# Patient Record
Sex: Female | Born: 1965 | Race: White | Hispanic: No | Marital: Single | Smoking: Current every day smoker
Health system: Southern US, Community
[De-identification: ages and names within clinical notes are randomized; demographics above are authoritative.]

## PROBLEM LIST (undated history)

## (undated) DIAGNOSIS — C799 Secondary malignant neoplasm of unspecified site: Secondary | ICD-10-CM

## (undated) DIAGNOSIS — E039 Hypothyroidism, unspecified: Secondary | ICD-10-CM

## (undated) DIAGNOSIS — F319 Bipolar disorder, unspecified: Secondary | ICD-10-CM

## (undated) DIAGNOSIS — C801 Malignant (primary) neoplasm, unspecified: Secondary | ICD-10-CM

## (undated) DIAGNOSIS — F4310 Post-traumatic stress disorder, unspecified: Secondary | ICD-10-CM

## (undated) DIAGNOSIS — C439 Malignant melanoma of skin, unspecified: Secondary | ICD-10-CM

## (undated) HISTORY — PX: MELANOMA EXCISION: SHX5266

---

## 2005-04-07 HISTORY — PX: MASTECTOMY: SHX3

## 2016-03-04 ENCOUNTER — Encounter (HOSPITAL_COMMUNITY): Payer: Self-pay | Admitting: Emergency Medicine

## 2016-03-04 ENCOUNTER — Inpatient Hospital Stay (HOSPITAL_COMMUNITY)
Admission: EM | Admit: 2016-03-04 | Discharge: 2016-03-08 | DRG: 070 | Disposition: A | Discharge status: Home / Self Care | Payer: Medicaid Other | Attending: Family Medicine | Admitting: Family Medicine

## 2016-03-04 ENCOUNTER — Emergency Department (HOSPITAL_COMMUNITY): Payer: Medicaid Other

## 2016-03-04 DIAGNOSIS — F17210 Nicotine dependence, cigarettes, uncomplicated: Secondary | ICD-10-CM | POA: Diagnosis present

## 2016-03-04 DIAGNOSIS — J9600 Acute respiratory failure, unspecified whether with hypoxia or hypercapnia: Secondary | ICD-10-CM | POA: Diagnosis present

## 2016-03-04 DIAGNOSIS — F1910 Other psychoactive substance abuse, uncomplicated: Secondary | ICD-10-CM | POA: Diagnosis present

## 2016-03-04 DIAGNOSIS — L02512 Cutaneous abscess of left hand: Secondary | ICD-10-CM | POA: Diagnosis present

## 2016-03-04 DIAGNOSIS — S022XXA Fracture of nasal bones, initial encounter for closed fracture: Secondary | ICD-10-CM

## 2016-03-04 DIAGNOSIS — C7972 Secondary malignant neoplasm of left adrenal gland: Secondary | ICD-10-CM | POA: Diagnosis present

## 2016-03-04 DIAGNOSIS — C7801 Secondary malignant neoplasm of right lung: Secondary | ICD-10-CM | POA: Diagnosis present

## 2016-03-04 DIAGNOSIS — Z79899 Other long term (current) drug therapy: Secondary | ICD-10-CM

## 2016-03-04 DIAGNOSIS — R55 Syncope and collapse: Secondary | ICD-10-CM

## 2016-03-04 DIAGNOSIS — Z85820 Personal history of malignant melanoma of skin: Secondary | ICD-10-CM

## 2016-03-04 DIAGNOSIS — R4182 Altered mental status, unspecified: Secondary | ICD-10-CM | POA: Diagnosis present

## 2016-03-04 DIAGNOSIS — R4781 Slurred speech: Secondary | ICD-10-CM | POA: Diagnosis present

## 2016-03-04 DIAGNOSIS — E039 Hypothyroidism, unspecified: Secondary | ICD-10-CM

## 2016-03-04 DIAGNOSIS — L8990 Pressure ulcer of unspecified site, unspecified stage: Secondary | ICD-10-CM | POA: Diagnosis present

## 2016-03-04 DIAGNOSIS — I679 Cerebrovascular disease, unspecified: Secondary | ICD-10-CM | POA: Diagnosis present

## 2016-03-04 DIAGNOSIS — F4310 Post-traumatic stress disorder, unspecified: Secondary | ICD-10-CM | POA: Diagnosis present

## 2016-03-04 DIAGNOSIS — L03012 Cellulitis of left finger: Secondary | ICD-10-CM | POA: Diagnosis present

## 2016-03-04 DIAGNOSIS — W19XXXA Unspecified fall, initial encounter: Secondary | ICD-10-CM | POA: Diagnosis present

## 2016-03-04 DIAGNOSIS — F319 Bipolar disorder, unspecified: Secondary | ICD-10-CM | POA: Diagnosis present

## 2016-03-04 DIAGNOSIS — Z79891 Long term (current) use of opiate analgesic: Secondary | ICD-10-CM | POA: Diagnosis not present

## 2016-03-04 DIAGNOSIS — Z791 Long term (current) use of non-steroidal anti-inflammatories (NSAID): Secondary | ICD-10-CM | POA: Diagnosis not present

## 2016-03-04 DIAGNOSIS — C799 Secondary malignant neoplasm of unspecified site: Secondary | ICD-10-CM

## 2016-03-04 DIAGNOSIS — C439 Malignant melanoma of skin, unspecified: Secondary | ICD-10-CM | POA: Diagnosis present

## 2016-03-04 DIAGNOSIS — J9601 Acute respiratory failure with hypoxia: Secondary | ICD-10-CM | POA: Diagnosis present

## 2016-03-04 DIAGNOSIS — S0292XA Unspecified fracture of facial bones, initial encounter for closed fracture: Secondary | ICD-10-CM | POA: Diagnosis present

## 2016-03-04 DIAGNOSIS — G8929 Other chronic pain: Secondary | ICD-10-CM | POA: Diagnosis present

## 2016-03-04 DIAGNOSIS — R410 Disorientation, unspecified: Secondary | ICD-10-CM | POA: Diagnosis present

## 2016-03-04 DIAGNOSIS — G9340 Encephalopathy, unspecified: Principal | ICD-10-CM | POA: Diagnosis present

## 2016-03-04 DIAGNOSIS — C7931 Secondary malignant neoplasm of brain: Secondary | ICD-10-CM | POA: Diagnosis present

## 2016-03-04 HISTORY — DX: Bipolar disorder, unspecified: F31.9

## 2016-03-04 HISTORY — DX: Malignant (primary) neoplasm, unspecified: C80.1

## 2016-03-04 HISTORY — DX: Secondary malignant neoplasm of unspecified site: C79.9

## 2016-03-04 HISTORY — DX: Post-traumatic stress disorder, unspecified: F43.10

## 2016-03-04 HISTORY — DX: Hypothyroidism, unspecified: E03.9

## 2016-03-04 HISTORY — DX: Malignant melanoma of skin, unspecified: C43.9

## 2016-03-04 LAB — URINALYSIS, ROUTINE W REFLEX MICROSCOPIC
Glucose, UA: NEGATIVE mg/dL
KETONES UR: NEGATIVE mg/dL
NITRITE: POSITIVE — AB
PROTEIN: NEGATIVE mg/dL
Specific Gravity, Urine: 1.026 (ref 1.005–1.030)
pH: 5.5 (ref 5.0–8.0)

## 2016-03-04 LAB — COMPREHENSIVE METABOLIC PANEL
ALK PHOS: 79 U/L (ref 38–126)
ALT: 20 U/L (ref 14–54)
ANION GAP: 14 (ref 5–15)
AST: 47 U/L — ABNORMAL HIGH (ref 15–41)
Albumin: 3.9 g/dL (ref 3.5–5.0)
BILIRUBIN TOTAL: 0.7 mg/dL (ref 0.3–1.2)
BUN: 29 mg/dL — ABNORMAL HIGH (ref 6–20)
CALCIUM: 9.2 mg/dL (ref 8.9–10.3)
CO2: 32 mmol/L (ref 22–32)
Chloride: 98 mmol/L — ABNORMAL LOW (ref 101–111)
Creatinine, Ser: 1.1 mg/dL — ABNORMAL HIGH (ref 0.44–1.00)
GFR calc non Af Amer: 58 mL/min — ABNORMAL LOW (ref >60)
Glucose, Bld: 115 mg/dL — ABNORMAL HIGH (ref 65–99)
Potassium: 4.3 mmol/L (ref 3.5–5.1)
SODIUM: 144 mmol/L (ref 135–145)
TOTAL PROTEIN: 7 g/dL (ref 6.5–8.1)

## 2016-03-04 LAB — CBC WITH DIFFERENTIAL/PLATELET
Basophils Absolute: 0 10*3/uL (ref 0.0–0.1)
Basophils Relative: 0 %
EOS ABS: 0 10*3/uL (ref 0.0–0.7)
Eosinophils Relative: 0 %
HCT: 37.4 % (ref 36.0–46.0)
HEMOGLOBIN: 11.8 g/dL — AB (ref 12.0–15.0)
LYMPHS ABS: 0.5 10*3/uL — AB (ref 0.7–4.0)
Lymphocytes Relative: 6 %
MCH: 29.8 pg (ref 26.0–34.0)
MCHC: 31.6 g/dL (ref 30.0–36.0)
MCV: 94.4 fL (ref 78.0–100.0)
MONOS PCT: 6 %
Monocytes Absolute: 0.6 10*3/uL (ref 0.1–1.0)
NEUTROS PCT: 88 %
Neutro Abs: 8 10*3/uL — ABNORMAL HIGH (ref 1.7–7.7)
Platelets: 188 10*3/uL (ref 150–400)
RBC: 3.96 MIL/uL (ref 3.87–5.11)
RDW: 14.7 % (ref 11.5–15.5)
WBC: 9.1 10*3/uL (ref 4.0–10.5)

## 2016-03-04 LAB — URINE MICROSCOPIC-ADD ON

## 2016-03-04 LAB — VALPROIC ACID LEVEL
VALPROIC ACID LVL: 28 ug/mL — AB (ref 50.0–100.0)
VALPROIC ACID LVL: 25 ug/mL — AB (ref 50.0–100.0)

## 2016-03-04 LAB — LACTIC ACID, PLASMA
Lactic Acid, Venous: 2 mmol/L (ref 0.5–1.9)
Lactic Acid, Venous: 0.7 mmol/L (ref 0.5–1.9)

## 2016-03-04 LAB — RAPID URINE DRUG SCREEN, HOSP PERFORMED
Amphetamines: NOT DETECTED
Barbiturates: NOT DETECTED
Benzodiazepines: POSITIVE — AB
COCAINE: NOT DETECTED
OPIATES: POSITIVE — AB
Tetrahydrocannabinol: POSITIVE — AB

## 2016-03-04 LAB — TROPONIN I
TROPONIN I: 0.74 ng/mL — AB (ref <0.03)
TROPONIN I: 0.56 ng/mL — AB (ref <0.03)

## 2016-03-04 LAB — TSH: TSH: 1.578 u[IU]/mL (ref 0.350–4.500)

## 2016-03-04 LAB — CK: Total CK: 670 U/L — ABNORMAL HIGH (ref 38–234)

## 2016-03-04 LAB — MAGNESIUM: Magnesium: 2.2 mg/dL (ref 1.7–2.4)

## 2016-03-04 LAB — ETHANOL

## 2016-03-04 LAB — PHOSPHORUS: PHOSPHORUS: 1.9 mg/dL — AB (ref 2.5–4.6)

## 2016-03-04 MED ORDER — PROCHLORPERAZINE MALEATE 10 MG PO TABS
10.0000 mg | ORAL_TABLET | Freq: Four times a day (QID) | ORAL | Status: DC | PRN
  Filled 2016-03-04: qty 1

## 2016-03-04 MED ORDER — QUETIAPINE FUMARATE 25 MG PO TABS
150.0000 mg | ORAL_TABLET | Freq: Every day | ORAL | Status: DC
  Administered 2016-03-05 – 2016-03-08 (×4): 150 mg via ORAL
  Filled 2016-03-04 (×4): qty 6

## 2016-03-04 MED ORDER — ONDANSETRON HCL 4 MG PO TABS: 4.0000 mg | ORAL_TABLET | Freq: Four times a day (QID) | ORAL | Status: DC | PRN

## 2016-03-04 MED ORDER — LORAZEPAM 2 MG/ML IJ SOLN
INTRAMUSCULAR | Status: AC
  Filled 2016-03-04: qty 1

## 2016-03-04 MED ORDER — SENNA 8.6 MG PO TABS
2.0000 | ORAL_TABLET | Freq: Two times a day (BID) | ORAL | Status: DC
  Administered 2016-03-05 – 2016-03-08 (×7): 17.2 mg via ORAL
  Filled 2016-03-04 (×8): qty 2

## 2016-03-04 MED ORDER — MORPHINE SULFATE ER 30 MG PO TBCR
60.0000 mg | EXTENDED_RELEASE_TABLET | Freq: Two times a day (BID) | ORAL | Status: DC
  Administered 2016-03-05 – 2016-03-08 (×7): 60 mg via ORAL
  Filled 2016-03-04 (×8): qty 2

## 2016-03-04 MED ORDER — ZIPRASIDONE HCL 20 MG PO CAPS
20.0000 mg | ORAL_CAPSULE | Freq: Once | ORAL | Status: AC
  Administered 2016-03-04: 20 mg via ORAL
  Filled 2016-03-04: qty 1

## 2016-03-04 MED ORDER — ACETAMINOPHEN 650 MG RE SUPP: 650.0000 mg | Freq: Four times a day (QID) | RECTAL | Status: DC | PRN

## 2016-03-04 MED ORDER — DIVALPROEX SODIUM 250 MG PO DR TAB
250.0000 mg | DELAYED_RELEASE_TABLET | Freq: Every day | ORAL | Status: DC
  Administered 2016-03-06 – 2016-03-07 (×2): 250 mg via ORAL
  Filled 2016-03-04 (×5): qty 1

## 2016-03-04 MED ORDER — NALOXONE HCL 0.4 MG/ML IJ SOLN: 0.4000 mg | INTRAMUSCULAR | Status: DC | PRN

## 2016-03-04 MED ORDER — LORAZEPAM 2 MG/ML IJ SOLN
1.0000 mg | Freq: Once | INTRAMUSCULAR | Status: AC
  Administered 2016-03-04: 1 mg via INTRAMUSCULAR

## 2016-03-04 MED ORDER — ONDANSETRON HCL 4 MG/2ML IJ SOLN: 4.0000 mg | Freq: Four times a day (QID) | INTRAMUSCULAR | Status: DC | PRN

## 2016-03-04 MED ORDER — LORAZEPAM 1 MG PO TABS
2.0000 mg | ORAL_TABLET | Freq: Once | ORAL | Status: AC
  Administered 2016-03-04: 2 mg via ORAL
  Filled 2016-03-04: qty 2

## 2016-03-04 MED ORDER — LEVOTHYROXINE SODIUM 25 MCG PO TABS
125.0000 ug | ORAL_TABLET | Freq: Every day | ORAL | Status: DC
  Administered 2016-03-05 – 2016-03-08 (×4): 125 ug via ORAL
  Filled 2016-03-04 (×5): qty 1

## 2016-03-04 MED ORDER — DEXTROSE 5 % IV SOLN
1.0000 g | INTRAVENOUS | Status: DC
  Filled 2016-03-04: qty 10

## 2016-03-04 MED ORDER — SODIUM CHLORIDE 0.9% FLUSH
3.0000 mL | Freq: Two times a day (BID) | INTRAVENOUS | Status: DC
  Administered 2016-03-04 – 2016-03-06 (×4): 3 mL via INTRAVENOUS

## 2016-03-04 MED ORDER — POLYETHYLENE GLYCOL 3350 17 G PO PACK: 17.0000 g | PACK | Freq: Two times a day (BID) | ORAL | Status: DC | PRN

## 2016-03-04 MED ORDER — LORAZEPAM 2 MG/ML IJ SOLN
1.0000 mg | Freq: Four times a day (QID) | INTRAMUSCULAR | Status: DC | PRN
  Administered 2016-03-04 – 2016-03-06 (×6): 1 mg via INTRAVENOUS
  Filled 2016-03-04 (×5): qty 1

## 2016-03-04 MED ORDER — NICOTINE 14 MG/24HR TD PT24
14.0000 mg | MEDICATED_PATCH | Freq: Every day | TRANSDERMAL | Status: DC
  Administered 2016-03-05 – 2016-03-08 (×4): 14 mg via TRANSDERMAL
  Filled 2016-03-04 (×4): qty 1

## 2016-03-04 MED ORDER — SODIUM CHLORIDE 0.9 % IV SOLN
INTRAVENOUS | Status: DC
  Administered 2016-03-04: 16:00:00 via INTRAVENOUS

## 2016-03-04 MED ORDER — SODIUM CHLORIDE 0.9 % IV BOLUS (SEPSIS)
1000.0000 mL | Freq: Once | INTRAVENOUS | Status: AC
  Administered 2016-03-04: 1000 mL via INTRAVENOUS

## 2016-03-04 MED ORDER — ACETAMINOPHEN 325 MG PO TABS
650.0000 mg | ORAL_TABLET | Freq: Four times a day (QID) | ORAL | Status: DC | PRN
  Administered 2016-03-06: 650 mg via ORAL
  Filled 2016-03-04: qty 2

## 2016-03-04 NOTE — ED Notes (Signed)
Pt given graham crackers and ice water, per Vicente Males, Therapist, sports.

## 2016-03-04 NOTE — ED Notes (Signed)
Patient unable to urinate at this time; patient repositioned on stretcher

## 2016-03-04 NOTE — ED Notes (Signed)
Pt placed in C collar.  Periodically O2 saturations drop to the 80s.  Nasal cannula not efffective due to facial injury.  PLaced on non re breather to increase o2 saturation to 90s.

## 2016-03-04 NOTE — ED Notes (Signed)
Assisted pt to stand at bedside with the assistance of Shanon Brow (EMT) and Martin Majestic (EMT). Couldn't tolerate with muscle spasms.

## 2016-03-04 NOTE — ED Provider Notes (Signed)
[facility] DEPT Provider Note   CSN: [id number] Arrival date & time: 03/04/16  0541     History   Chief Complaint Chief Complaint  Patient presents with  . Fall  . Facial Injury    HPI Yolanda Krueger is a 50 y.o. female.  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 Pt reports falling and landing on her face but is unable to report any other details regarding the incident. LOC unknown. Pt reports drinking alcohol last night. Denies drug use. Endorses pain to her nose and neck. Denies fever, lightheadedness, dizziness, visual changes, cough, chest pain, SOB, back pain, abdominal pain, vomiting, diarrhea, urinary sxs, extremity pain, swelling.   Level 5 caveat- AMS/slurred speech  Oncologist- Dr. Harriet Masson [facility])  Past Medical History:  Diagnosis Date  . Bipolar 1 disorder ([place])   . Cancer ([place])   . Hypothyroid   . Malignant melanoma, metastatic ([place])   . PTSD (post-traumatic stress disorder)     Patient Active Problem List   Diagnosis Date Noted  . Altered mental status 03/04/2016  . Syncope 03/04/2016  . Metastatic melanoma ([place]) 03/04/2016  . Extensive facial fractures ([place]) 03/04/2016  . Bipolar 1 disorder ([place]) 03/04/2016  . Hypothyroid 03/04/2016  . Acute respiratory failure ([place]) 03/04/2016  . PTSD (post-traumatic stress disorder) 03/04/2016  . Closed fracture of nasal bones   . Confusion     Past Surgical History:  Procedure Laterality Date  . MELANOMA EXCISION      OB History    No data available       Home Medications    Prior to Admission medications   Medication Sig Start Date End Date Taking? Authorizing Provider  divalproex (DEPAKOTE) 250 MG DR tablet Take 250 mg by mouth at bedtime.   Yes Historical Provider, MD  levothyroxine (SYNTHROID, LEVOTHROID) 125 MCG tablet Take 125 mcg by mouth daily  before breakfast.   Yes Historical Provider, MD  morphine (MS CONTIN) 100 MG 12 hr tablet Take 100 mg by mouth every 12 (twelve) hours.   Yes Historical Provider, MD  naproxen (NAPROSYN) 375 MG tablet Take 375 mg by mouth 2 (two) times daily with a meal.   Yes Historical Provider, MD  ondansetron (ZOFRAN) 8 MG tablet Take by mouth every 8 (eight) hours as needed for nausea or vomiting.   Yes Historical Provider, MD  polyethylene glycol (MIRALAX / GLYCOLAX) packet Take 17 g by mouth 2 (two) times daily as needed for mild constipation.   Yes Historical Provider, MD  prochlorperazine (COMPAZINE) 10 MG tablet Take 10 mg by mouth every 6 (six) hours as needed for nausea or vomiting.   Yes Historical Provider, MD  QUEtiapine (SEROQUEL) 100 MG tablet Take 150 mg by mouth daily.   Yes Historical Provider, MD  senna (SENOKOT) 8.6 MG TABS tablet Take 2 tablets by mouth 2 (two) times daily.   Yes Historical Provider, MD    Family History Family History  Problem Relation Age of Onset  . Family history unknown: Yes    Social History Social History  Substance Use Topics  . Smoking status: Current Every Day Smoker    Packs/day: 1.00    Types: Cigarettes  . Smokeless tobacco: Never Used  . Alcohol use Not on file     Allergies   Penicillins   Review of Systems  Review of Systems  HENT: Positive for facial swelling.   Musculoskeletal: Positive for neck pain.  Neurological: Positive for headaches.  All other systems reviewed and are negative.    Physical Exam Updated Vital Signs BP 129/78   Pulse 102   Temp 98.2 F (36.8 C) (Axillary)   Resp 14   LMP  (LMP Unknown)   SpO2 93%   Physical Exam  Constitutional: She is oriented to person, place, and time. She appears well-developed and well-nourished. No distress.  Pt with slurred speech.  HENT:  Head: Normocephalic. Head is with contusion. Head is without raccoon's eyes, without Battle's sign, without abrasion and without laceration.      Right Ear: Tympanic membrane normal. No hemotympanum.  Left Ear: Tympanic membrane normal. No hemotympanum.  Nose: Nasal deformity present. No septal deviation or nasal septal hematoma. Epistaxis (dried blood noted to bilatearl nares, no active bleeding) is observed. Right sinus exhibits no maxillary sinus tenderness and no frontal sinus tenderness. Left sinus exhibits no maxillary sinus tenderness and no frontal sinus tenderness.    Mouth/Throat: Uvula is midline, oropharynx is clear and moist and mucous membranes are normal. Abnormal dentition. No oropharyngeal exudate, posterior oropharyngeal edema, posterior oropharyngeal erythema or tonsillar abscesses. No tonsillar exudate.  Moderate swelling noted to nasal bridge with no palpable step-off or deformity. TTP. Poor dentition throughout with dried blood noted in mouth, no active bleeding.  Eyes: Conjunctivae and EOM are normal. Pupils are equal, round, and reactive to light. Right eye exhibits no discharge. Left eye exhibits no discharge. No scleral icterus.  Neck: Normal range of motion. Neck supple.  Cardiovascular: Normal rate, regular rhythm, normal heart sounds and intact distal pulses.   Pulmonary/Chest: Effort normal and breath sounds normal. No respiratory distress. She has no wheezes. She has no rales. She exhibits no tenderness.  Abdominal: Soft. Bowel sounds are normal. She exhibits no distension and no mass. There is no tenderness. There is no rebound and no guarding. No hernia.  Musculoskeletal: Normal range of motion. She exhibits tenderness. She exhibits no edema or deformity.  Midline cervical spine tenderness. No midline T, or L tenderness. C-collar in place. Full range of motion of bilateral upper and lower extremities, with 5/5 strength. Sensation intact. 2+ radial and PT pulses. Cap refill <2 seconds.   Neurological: She is alert and oriented to person, place, and time. She has normal strength. No cranial nerve deficit or  sensory deficit. GCS eye subscore is 4. GCS verbal subscore is 5. GCS motor subscore is 6.  Pt A&Ox3 however unable to recall details regarding her fall. Pt with slurred speech, endorses drinking last night. Pt follows commands. Spontaneously moving all 4 extremities.   Skin: Skin is warm and dry.  Nursing note and vitals reviewed.    ED Treatments / Results  Labs (all labs ordered are listed, but only abnormal results are displayed) Labs Reviewed  CBC WITH DIFFERENTIAL/PLATELET - Abnormal; Notable for the following:       Result Value   Hemoglobin 11.8 (*)    Neutro Abs 8.0 (*)    Lymphs Abs 0.5 (*)    All other components within normal limits  COMPREHENSIVE METABOLIC PANEL - Abnormal; Notable for the following:    Chloride 98 (*)    Glucose, Bld 115 (*)    BUN 29 (*)    Creatinine, Ser 1.10 (*)    AST 47 (*)    GFR calc non Af Amer 58 (*)    All other  components within normal limits  URINALYSIS, ROUTINE W REFLEX MICROSCOPIC (NOT AT [facility]) - Abnormal; Notable for the following:    Color, Urine AMBER (*)    APPearance HAZY (*)    Hgb urine dipstick LARGE (*)    Bilirubin Urine SMALL (*)    Nitrite POSITIVE (*)    Leukocytes, UA SMALL (*)    All other components within normal limits  RAPID URINE DRUG SCREEN, HOSP PERFORMED - Abnormal; Notable for the following:    Opiates POSITIVE (*)    Benzodiazepines POSITIVE (*)    Tetrahydrocannabinol POSITIVE (*)    All other components within normal limits  URINE MICROSCOPIC-ADD ON - Abnormal; Notable for the following:    Squamous Epithelial / LPF 6-30 (*)    Bacteria, UA MANY (*)    Casts HYALINE CASTS (*)    All other components within normal limits  VALPROIC ACID LEVEL - Abnormal; Notable for the following:    Valproic Acid Lvl 28 (*)    All other components within normal limits  URINE CULTURE  ETHANOL  PHOSPHORUS  MAGNESIUM  TSH  TROPONIN I  TROPONIN I  VALPROIC ACID LEVEL  LACTIC ACID, PLASMA  LACTIC ACID, PLASMA    POC URINE PREG, ED    EKG  EKG Interpretation  Date/Time:  Tuesday March 04 2016 05:43:03 EST Ventricular Rate:  96 PR Interval:    QRS Duration: 107 QT Interval:  371 QTC Calculation: 469 R Axis:   79 Text Interpretation:  Sinus rhythm RSR' in V1 or V2, right VCD or RVH Abnormal T, consider ischemia, anterior leads No previous tracing Confirmed by POLLINA  MD, CHRISTOPHER [phone]) on 03/04/2016 5:46:33 AM       Radiology Ct Head Wo Contrast  Result Date: 03/04/2016 CLINICAL DATA:  [place] and landed on face. Obvious nasal deformity. History of cancer. EXAM: CT HEAD WITHOUT CONTRAST CT MAXILLOFACIAL WITHOUT CONTRAST CT CERVICAL SPINE WITHOUT CONTRAST TECHNIQUE: Multidetector CT imaging of the head, cervical spine, and maxillofacial structures were performed using the standard protocol without intravenous contrast. Multiplanar CT image reconstructions of the cervical spine and maxillofacial structures were also generated. COMPARISON:  None. FINDINGS: CT HEAD FINDINGS Brain: No evidence for acute hemorrhage, mass lesion, midline shift, hydrocephalus or large infarct. There is focal low-density in the right frontal white matter which is nonspecific but suggestive for an old insult. Vascular: No hyperdense vessel or unexpected calcification. Skull: Nasal bone fractures. Refer to the facial CT for further characterization. No calvarial fracture. Other: None. CT MAXILLOFACIAL FINDINGS Osseous: There bilateral nasal bones fractures. The right nasal bone fracture demonstrates posterior displacement or depression. The anterior aspect of the nasal septum is fractured. Pterygoid plates appear to be intact. Zygomatic arches are intact. Mandibular condyles are located. There is focal bone loss involving the left mandibular ramus. Cannot exclude focal erosion at this site. There is a small bone fragment or chip involving the left mandibular condyle which may be chronic. There is erosion or fracture of the  remaining right lower molar. Evidence for a caries involving the most posterior left lower tooth. Patient is missing multiple teeth. Degenerative disc and endplate changes in the cervical spine. Prevertebral soft tissues are within normal limits. Orbits: Orbital bones are intact. Sinuses: Minimal mucosal thickening in the anterior ethmoid air cells. Small polyp in the right maxillary sinus. Mastoid air cells are aerated. Soft tissues: Normal appearance of both globes. Soft tissue swelling at the nose and the appears to be a hematoma along the anterior  right side of the nose. There is subcutaneous edema in the left submandibular region. There is edema along the left side of the neck. Parapharyngeal fat planes appear to be preserved. CT CERVICAL SPINE FINDINGS Alignment: Mild straightening of the cervical spine. Skull base and vertebrae: Negative for an acute fracture or dislocation. Soft tissues and spinal canal: Evidence for prior surgery on the left side of the neck with subcutaneous stranding or edema. Looks like there has been resection of the left sternocleidomastoid muscle. Evaluation of the neck tissue is limited without intravenous contrast. There is edema along the left side of the neck. Disc levels: There is marked disc space narrowing at C5-C6 with endplate changes. Broad-based disc osteophyte complex at C5-C6. Mild disc space narrowing at C4-C5 and C6-C7. There appears to be a left paracentral disc herniation or disc osteophyte complex at C6-C7. Upper chest: Emphysematous changes at the lung apices. Appears to be focal scarring at the left lung apex. Other: None IMPRESSION: No acute intracranial abnormality. Focal low density in the right frontal white matter may represent an old insult. Bilateral nasal bone fractures and fracture of the nasal septum. Extensive soft tissue swelling and hematoma formation at the nose. No acute bone abnormality in the cervical spine with multilevel degenerative changes,  particularly at C5-C6. Evidence for postsurgical changes on the left side of the neck with subcutaneous edema which could be related to postoperative change. Emphysematous changes at the lung apices with presumed scarring at the left lung apex. Focal lucency involving the left mandibular ramus is nonspecific. Cannot exclude a focal erosion in this area. There is also a small bone fragment or chip at the left mandibular condyle which is age indeterminate. Electronically Signed   By: Markus Daft M.D.   On: 03/04/2016 08:33   Ct Cervical Spine Wo Contrast  Result Date: 03/04/2016 CLINICAL DATA:  [place] and landed on face. Obvious nasal deformity. History of cancer. EXAM: CT HEAD WITHOUT CONTRAST CT MAXILLOFACIAL WITHOUT CONTRAST CT CERVICAL SPINE WITHOUT CONTRAST TECHNIQUE: Multidetector CT imaging of the head, cervical spine, and maxillofacial structures were performed using the standard protocol without intravenous contrast. Multiplanar CT image reconstructions of the cervical spine and maxillofacial structures were also generated. COMPARISON:  None. FINDINGS: CT HEAD FINDINGS Brain: No evidence for acute hemorrhage, mass lesion, midline shift, hydrocephalus or large infarct. There is focal low-density in the right frontal white matter which is nonspecific but suggestive for an old insult. Vascular: No hyperdense vessel or unexpected calcification. Skull: Nasal bone fractures. Refer to the facial CT for further characterization. No calvarial fracture. Other: None. CT MAXILLOFACIAL FINDINGS Osseous: There bilateral nasal bones fractures. The right nasal bone fracture demonstrates posterior displacement or depression. The anterior aspect of the nasal septum is fractured. Pterygoid plates appear to be intact. Zygomatic arches are intact. Mandibular condyles are located. There is focal bone loss involving the left mandibular ramus. Cannot exclude focal erosion at this site. There is a small bone fragment or chip  involving the left mandibular condyle which may be chronic. There is erosion or fracture of the remaining right lower molar. Evidence for a caries involving the most posterior left lower tooth. Patient is missing multiple teeth. Degenerative disc and endplate changes in the cervical spine. Prevertebral soft tissues are within normal limits. Orbits: Orbital bones are intact. Sinuses: Minimal mucosal thickening in the anterior ethmoid air cells. Small polyp in the right maxillary sinus. Mastoid air cells are aerated. Soft tissues: Normal appearance of both globes. Soft tissue  swelling at the nose and the appears to be a hematoma along the anterior right side of the nose. There is subcutaneous edema in the left submandibular region. There is edema along the left side of the neck. Parapharyngeal fat planes appear to be preserved. CT CERVICAL SPINE FINDINGS Alignment: Mild straightening of the cervical spine. Skull base and vertebrae: Negative for an acute fracture or dislocation. Soft tissues and spinal canal: Evidence for prior surgery on the left side of the neck with subcutaneous stranding or edema. Looks like there has been resection of the left sternocleidomastoid muscle. Evaluation of the neck tissue is limited without intravenous contrast. There is edema along the left side of the neck. Disc levels: There is marked disc space narrowing at C5-C6 with endplate changes. Broad-based disc osteophyte complex at C5-C6. Mild disc space narrowing at C4-C5 and C6-C7. There appears to be a left paracentral disc herniation or disc osteophyte complex at C6-C7. Upper chest: Emphysematous changes at the lung apices. Appears to be focal scarring at the left lung apex. Other: None IMPRESSION: No acute intracranial abnormality. Focal low density in the right frontal white matter may represent an old insult. Bilateral nasal bone fractures and fracture of the nasal septum. Extensive soft tissue swelling and hematoma formation at the  nose. No acute bone abnormality in the cervical spine with multilevel degenerative changes, particularly at C5-C6. Evidence for postsurgical changes on the left side of the neck with subcutaneous edema which could be related to postoperative change. Emphysematous changes at the lung apices with presumed scarring at the left lung apex. Focal lucency involving the left mandibular ramus is nonspecific. Cannot exclude a focal erosion in this area. There is also a small bone fragment or chip at the left mandibular condyle which is age indeterminate. Electronically Signed   By: Markus Daft M.D.   On: 03/04/2016 08:33   Dg Chest Port 1 View  Result Date: 03/04/2016 CLINICAL DATA:  Fall.  Chest pain. EXAM: PORTABLE CHEST 1 VIEW COMPARISON:  None. FINDINGS: The heart size and mediastinal contours are within normal limits. Both lungs are clear. No pneumothorax or pleural effusion is noted. Bilateral axillary surgical clips are noted. The visualized skeletal structures are unremarkable. IMPRESSION: No acute cardiopulmonary abnormality seen. Electronically Signed   By: Marijo Conception, M.D.   On: 03/04/2016 08:33   Ct Maxillofacial Wo Contrast  Result Date: 03/04/2016 CLINICAL DATA:  [place] and landed on face. Obvious nasal deformity. History of cancer. EXAM: CT HEAD WITHOUT CONTRAST CT MAXILLOFACIAL WITHOUT CONTRAST CT CERVICAL SPINE WITHOUT CONTRAST TECHNIQUE: Multidetector CT imaging of the head, cervical spine, and maxillofacial structures were performed using the standard protocol without intravenous contrast. Multiplanar CT image reconstructions of the cervical spine and maxillofacial structures were also generated. COMPARISON:  None. FINDINGS: CT HEAD FINDINGS Brain: No evidence for acute hemorrhage, mass lesion, midline shift, hydrocephalus or large infarct. There is focal low-density in the right frontal white matter which is nonspecific but suggestive for an old insult. Vascular: No hyperdense vessel or  unexpected calcification. Skull: Nasal bone fractures. Refer to the facial CT for further characterization. No calvarial fracture. Other: None. CT MAXILLOFACIAL FINDINGS Osseous: There bilateral nasal bones fractures. The right nasal bone fracture demonstrates posterior displacement or depression. The anterior aspect of the nasal septum is fractured. Pterygoid plates appear to be intact. Zygomatic arches are intact. Mandibular condyles are located. There is focal bone loss involving the left mandibular ramus. Cannot exclude focal erosion at this site. There is  a small bone fragment or chip involving the left mandibular condyle which may be chronic. There is erosion or fracture of the remaining right lower molar. Evidence for a caries involving the most posterior left lower tooth. Patient is missing multiple teeth. Degenerative disc and endplate changes in the cervical spine. Prevertebral soft tissues are within normal limits. Orbits: Orbital bones are intact. Sinuses: Minimal mucosal thickening in the anterior ethmoid air cells. Small polyp in the right maxillary sinus. Mastoid air cells are aerated. Soft tissues: Normal appearance of both globes. Soft tissue swelling at the nose and the appears to be a hematoma along the anterior right side of the nose. There is subcutaneous edema in the left submandibular region. There is edema along the left side of the neck. Parapharyngeal fat planes appear to be preserved. CT CERVICAL SPINE FINDINGS Alignment: Mild straightening of the cervical spine. Skull base and vertebrae: Negative for an acute fracture or dislocation. Soft tissues and spinal canal: Evidence for prior surgery on the left side of the neck with subcutaneous stranding or edema. Looks like there has been resection of the left sternocleidomastoid muscle. Evaluation of the neck tissue is limited without intravenous contrast. There is edema along the left side of the neck. Disc levels: There is marked disc space  narrowing at C5-C6 with endplate changes. Broad-based disc osteophyte complex at C5-C6. Mild disc space narrowing at C4-C5 and C6-C7. There appears to be a left paracentral disc herniation or disc osteophyte complex at C6-C7. Upper chest: Emphysematous changes at the lung apices. Appears to be focal scarring at the left lung apex. Other: None IMPRESSION: No acute intracranial abnormality. Focal low density in the right frontal white matter may represent an old insult. Bilateral nasal bone fractures and fracture of the nasal septum. Extensive soft tissue swelling and hematoma formation at the nose. No acute bone abnormality in the cervical spine with multilevel degenerative changes, particularly at C5-C6. Evidence for postsurgical changes on the left side of the neck with subcutaneous edema which could be related to postoperative change. Emphysematous changes at the lung apices with presumed scarring at the left lung apex. Focal lucency involving the left mandibular ramus is nonspecific. Cannot exclude a focal erosion in this area. There is also a small bone fragment or chip at the left mandibular condyle which is age indeterminate. Electronically Signed   By: Markus Daft M.D.   On: 03/04/2016 08:33    Procedures Procedures (including critical care time)  Medications Ordered in ED Medications  LORazepam (ATIVAN) 2 MG/ML injection (not administered)  divalproex (DEPAKOTE) DR tablet 250 mg (not administered)  levothyroxine (SYNTHROID, LEVOTHROID) tablet 125 mcg (not administered)  prochlorperazine (COMPAZINE) tablet 10 mg (not administered)  QUEtiapine (SEROQUEL) tablet 150 mg (not administered)  senna (SENOKOT) tablet 17.2 mg (not administered)  polyethylene glycol (MIRALAX / GLYCOLAX) packet 17 g (not administered)  sodium chloride flush (NS) 0.9 % injection 3 mL (not administered)  0.9 %  sodium chloride infusion (not administered)  acetaminophen (TYLENOL) tablet 650 mg (not administered)    Or    acetaminophen (TYLENOL) suppository 650 mg (not administered)  ondansetron (ZOFRAN) tablet 4 mg (not administered)    Or  ondansetron (ZOFRAN) injection 4 mg (not administered)  morphine (MS CONTIN) 12 hr tablet 60 mg (not administered)  naloxone (NARCAN) injection 0.4 mg (not administered)  nicotine (NICODERM CQ - dosed in mg/24 hours) patch 14 mg (not administered)  LORazepam (ATIVAN) injection 1 mg (1 mg Intravenous Given 03/04/16 1509)  cefTRIAXone (  ROCEPHIN) 1 g in dextrose 5 % 50 mL IVPB (not administered)  LORazepam (ATIVAN) tablet 2 mg (not administered)  sodium chloride 0.9 % bolus 1,000 mL (0 mLs Intravenous Stopped 03/04/16 0945)  LORazepam (ATIVAN) injection 1 mg (1 mg Intramuscular Given 03/04/16 1330)  ziprasidone (GEODON) capsule 20 mg (20 mg Oral Given 03/04/16 1458)     Initial Impression / Assessment and Plan / ED Course  I have reviewed the triage vital signs and the nursing notes.  Pertinent labs & imaging results that were available during my care of the patient were reviewed by me and considered in my medical decision making (see chart for details).  Clinical Course     Patient presents via EMS status post unwitnessed fall with altered mental status. History of melanoma with metastases to brain. Patient endorses drinking alcohol last night but is unable to give details regarding her fall. Endorses pain to nose, denies any other sxs. VSS. Exam revealed moderate swelling and contusion to nasal bridge with contusion to left eyebrow. Midline cervical spine tenderness, no step offs or deformities noted. Patient alert and oriented 4. Patient with slurred speech however following commands and answering questions. No other neuro deficits noted.   EtOH negative. UDS positive for benzos, THC and opiates. BUN 29. Creatinine 1.1. Chest x-ray negative. CT head, cervical spine and maxillofacial revealed bilateral nasal bone fractures and fracture of nasal septum with soft tissue  swelling and hematoma present, no acute intracranial abnormalities.  Discussed pt with Dr. Ashok Cordia who evaluated the pt in the ED. Pt unable to ambulate in the ED. Plan to admit pt for AMS. Consulted hospitalist. Dr. Marily Memos agrees to admission. Consulted facial trauma regarding nasal fx, Dr. Merri Ray reviewed imaging and reports emergent tx/surgery is not warranted at this time. Plan to evaluate pt during her admission.  I was contacted by pt's oncologist Dr. Darleen Crocker. She Reported that patient was initially diagnosed with melanoma and 2010 and had a radical neck surgery performed. Patient was then lost follow-up and reestablish care with Dr. Darleen Crocker in [id number]. At that time patient was started on chemotherapy and had an MRI brain performed in November 2016 showing brain metastases. She states that patient was admitted last Tuesday for change in behavior and slurred speech. An MR brain was performed on 02/26/16 which showed stability of medial right frontal lobe lesion with no acute changes. She states she had patient evaluated by her psychiatrist during her admission due to concern for patient having a psychiatric problem however after [facility] evaluation they thought the patient's symptoms were due to medical problems. Patient is scheduled to have an EGD performed next week to evaluate pain possibly related to side effects from her chemotherapy.  Final Clinical Impressions(s) / ED Diagnoses   Final diagnoses:  Closed fracture of nasal bone, initial encounter  Fall, initial encounter  Altered mental status, unspecified altered mental status type  Metastatic melanoma ([place])  Confusion  Syncope  Metastatic cancer [facility])    New Prescriptions New Prescriptions   No medications on file     Nona Dell, PA-C 03/04/16 [place], MD 03/05/16 [phone]

## 2016-03-04 NOTE — ED Triage Notes (Signed)
Per EMS pt fell and landed on her face.  Obvious nasal deformity.  History of brain cancer with metastasis.  She is a pt at [facility], recently moved here.  Pt has muscle spasms that cause her to involuntarily move sporadically

## 2016-03-04 NOTE — ED Notes (Signed)
Pt pulled

## 2016-03-04 NOTE — Progress Notes (Signed)
Pt. Arrived to unit 5W with security and two ED techs. Pt. Arrived without IV access, and confused/agitated. Pt. Speech garbled and patient continuing to try to climb out of bed and remove clothing. Pt. Was given 2mg  Ativan orally and MD notified. MD verbal orders for 5mg  Haldol IM and soft wrist restraints. IV team consulted to start IV and was placed successfully. NS started at 75 ml/hr. Pt. Ativan began to take effect and patient relaxed. MD verbal orders not initiated at this time. Unit nurse tech placed at bedside as Air cabin crew. Will continue to monitor.

## 2016-03-04 NOTE — H&P (Addendum)
History and Physical    Yolanda Krueger [id number] DOB: 1966-03-12 DOA: 03/04/2016  PCP: No primary care provider on file. Patient coming from:  home  Chief Complaint:  Fall - AMS  HPI: Yolanda Krueger is a 50 y.o. 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 Pt presenting w/ intermittent confusion and facial injuries. Level 5 caveat applies as pt confused and unable to provide history at time of my examination. Pt w/o contact information listed in the chart for family/friends. History provided on limited basis but patient and workup principally by nursing and EDP reports. Reports patient had a single fall. Fell straight forward striking her nose. Unclear if there is loss of consciousness. Reports drinking alcohol the night prior to episode. Denies drug use. Difficulty breathing through her nose after fall. Faulty pain in left side of face. Per report there've been no reported fevers, chest pain, cough, shortness of breath, vertigo, abdominal pain, diarrhea, emesis, dysuria or frequency.   ED Course: Objective findings below. Patient became somewhat belligerent for a time and was given Ativan. Patient appears to be moderately hypoxic typically when resting deeply and trying to breath through her nose.   Review of Systems: As per HPI otherwise 10 point review of systems negative.   Ambulatory Status: no restrictions  Past Medical History:  Diagnosis Date  . Bipolar 1 disorder ([place])   . Cancer ([place])   . Hypothyroid   . Malignant melanoma, metastatic ([place])   . PTSD (post-traumatic stress disorder)     Past Surgical History:  Procedure Laterality Date  . MELANOMA EXCISION      Social History   Social History  . Marital status: Single    Spouse name: N/A  . Number of children: N/A  . Years of education: N/A   Occupational History  . Not  on file.   Social History Main Topics  . Smoking status: Current Every Day Smoker    Packs/day: 1.00    Types: Cigarettes  . Smokeless tobacco: Never Used  . Alcohol use Not on file  . Drug use: Unknown  . Sexual activity: Not on file   Other Topics Concern  . Not on file   Social History Narrative  . No narrative on file    Allergies  Allergen Reactions  . Penicillins Rash    Family History  Problem Relation Age of Onset  . Family history unknown: Yes  unable to obtain due to pts condition  Prior to Admission medications   Medication Sig Start Date End Date Taking? Authorizing Provider  divalproex (DEPAKOTE) 250 MG DR tablet Take 250 mg by mouth at bedtime.   Yes Historical Provider, MD  levothyroxine (SYNTHROID, LEVOTHROID) 125 MCG tablet Take 125 mcg by mouth daily before breakfast.   Yes Historical Provider, MD  morphine (MS CONTIN) 100 MG 12 hr tablet Take 100 mg by mouth every 12 (twelve) hours.   Yes Historical Provider, MD  naproxen (NAPROSYN) 375 MG tablet Take 375 mg by mouth 2 (two) times daily with a meal.   Yes Historical Provider, MD  ondansetron (ZOFRAN) 8 MG tablet Take by mouth every 8 (eight) hours as needed for nausea or vomiting.   Yes Historical Provider, MD  polyethylene glycol (MIRALAX / GLYCOLAX) packet Take 17 g by mouth 2 (two) times daily as needed for mild constipation.   Yes Historical Provider, MD  prochlorperazine (  COMPAZINE) 10 MG tablet Take 10 mg by mouth every 6 (six) hours as needed for nausea or vomiting.   Yes Historical Provider, MD  QUEtiapine (SEROQUEL) 100 MG tablet Take 150 mg by mouth daily.   Yes Historical Provider, MD  senna (SENOKOT) 8.6 MG TABS tablet Take 2 tablets by mouth 2 (two) times daily.   Yes Historical Provider, MD    Physical Exam: Vitals:   03/04/16 1215 03/04/16 1240 03/04/16 1245 03/04/16 1315  BP: 116/96 118/86 111/78 129/78  Pulse: 103 108 106 102  Resp:  14    Temp:      TempSrc:      SpO2: (!) 89% 99%  100% 93%     General:  Appears calm and comfortable Eyes:  PERRL, EOMI, normal lids, iris ENT: Nasal bridge significantly enlarged and tender to palpation. Left jaw tender from the angle of the mandible to the TMJ. Dry mucous membranes. Neck:  no LAD, masses or thyromegaly Cardiovascular:  RRR, II/VI systolic murmur. No LE edema.  Respiratory:  CTA bilaterally, no w/r/r. Normal respiratory effort. Abdomen:  soft, ntnd, NABS Skin: Areas of abrasion noted on patient's face, hand, leg Musculoskeletal:  grossly normal tone BUE/BLE, good ROM, no bony abnormality Psychiatric: Difficult to fully assess due to patient's mental status. Will not follow basic commands. Alert and oriented 0. Quickly falls back to sleep. Neurologic: Unable to fully assess. No facial asymmetry. Moves all extremities. Sensation appears intact. CN 2-12 grossly intact, moves all extremities in coordinated fashion, sensation intact  Labs on Admission: I have personally reviewed following labs and imaging studies  CBC:  Recent Labs Lab 03/04/16 0623  WBC 9.1  NEUTROABS 8.0*  HGB 11.8*  HCT 37.4  MCV 94.4  PLT 0000000   Basic Metabolic Panel:  Recent Labs Lab 03/04/16 0623  NA 144  K 4.3  CL 98*  CO2 32  GLUCOSE 115*  BUN 29*  CREATININE 1.10*  CALCIUM 9.2   GFR: CrCl cannot be calculated (Unknown ideal weight.). Liver Function Tests:  Recent Labs Lab 03/04/16 0623  AST 47*  ALT 20  ALKPHOS 79  BILITOT 0.7  PROT 7.0  ALBUMIN 3.9   No results for input(s): LIPASE, AMYLASE in the last 168 hours. No results for input(s): AMMONIA in the last 168 hours. Coagulation Profile: No results for input(s): INR, PROTIME in the last 168 hours. Cardiac Enzymes: No results for input(s): CKTOTAL, CKMB, CKMBINDEX, TROPONINI in the last 168 hours. BNP (last 3 results) No results for input(s): PROBNP in the last 8760 hours. HbA1C: No results for input(s): HGBA1C in the last 72 hours. CBG: No results for  input(s): GLUCAP in the last 168 hours. Lipid Profile: No results for input(s): CHOL, HDL, LDLCALC, TRIG, CHOLHDL, LDLDIRECT in the last 72 hours. Thyroid Function Tests: No results for input(s): TSH, T4TOTAL, FREET4, T3FREE, THYROIDAB in the last 72 hours. Anemia Panel: No results for input(s): VITAMINB12, FOLATE, FERRITIN, TIBC, IRON, RETICCTPCT in the last 72 hours. Urine analysis:    Component Value Date/Time   COLORURINE AMBER (A) 03/04/2016 1007   APPEARANCEUR HAZY (A) 03/04/2016 1007   LABSPEC 1.026 03/04/2016 1007   PHURINE 5.5 03/04/2016 1007   GLUCOSEU NEGATIVE 03/04/2016 1007   HGBUR LARGE (A) 03/04/2016 1007   BILIRUBINUR SMALL (A) 03/04/2016 1007   KETONESUR NEGATIVE 03/04/2016 1007   PROTEINUR NEGATIVE 03/04/2016 1007   NITRITE POSITIVE (A) 03/04/2016 1007   LEUKOCYTESUR SMALL (A) 03/04/2016 1007    Creatinine Clearance: CrCl cannot be  calculated (Unknown ideal weight.).  Sepsis Labs: @LABRCNTIP (procalcitonin:4,lacticidven:4) )No results found for this or any previous visit (from the past 240 hour(s)).   Radiological Exams on Admission: Ct Head Wo Contrast  Result Date: 03/04/2016 CLINICAL DATA:  [place] and landed on face. Obvious nasal deformity. History of cancer. EXAM: CT HEAD WITHOUT CONTRAST CT MAXILLOFACIAL WITHOUT CONTRAST CT CERVICAL SPINE WITHOUT CONTRAST TECHNIQUE: Multidetector CT imaging of the head, cervical spine, and maxillofacial structures were performed using the standard protocol without intravenous contrast. Multiplanar CT image reconstructions of the cervical spine and maxillofacial structures were also generated. COMPARISON:  None. FINDINGS: CT HEAD FINDINGS Brain: No evidence for acute hemorrhage, mass lesion, midline shift, hydrocephalus or large infarct. There is focal low-density in the right frontal white matter which is nonspecific but suggestive for an old insult. Vascular: No hyperdense vessel or unexpected calcification. Skull: Nasal bone  fractures. Refer to the facial CT for further characterization. No calvarial fracture. Other: None. CT MAXILLOFACIAL FINDINGS Osseous: There bilateral nasal bones fractures. The right nasal bone fracture demonstrates posterior displacement or depression. The anterior aspect of the nasal septum is fractured. Pterygoid plates appear to be intact. Zygomatic arches are intact. Mandibular condyles are located. There is focal bone loss involving the left mandibular ramus. Cannot exclude focal erosion at this site. There is a small bone fragment or chip involving the left mandibular condyle which may be chronic. There is erosion or fracture of the remaining right lower molar. Evidence for a caries involving the most posterior left lower tooth. Patient is missing multiple teeth. Degenerative disc and endplate changes in the cervical spine. Prevertebral soft tissues are within normal limits. Orbits: Orbital bones are intact. Sinuses: Minimal mucosal thickening in the anterior ethmoid air cells. Small polyp in the right maxillary sinus. Mastoid air cells are aerated. Soft tissues: Normal appearance of both globes. Soft tissue swelling at the nose and the appears to be a hematoma along the anterior right side of the nose. There is subcutaneous edema in the left submandibular region. There is edema along the left side of the neck. Parapharyngeal fat planes appear to be preserved. CT CERVICAL SPINE FINDINGS Alignment: Mild straightening of the cervical spine. Skull base and vertebrae: Negative for an acute fracture or dislocation. Soft tissues and spinal canal: Evidence for prior surgery on the left side of the neck with subcutaneous stranding or edema. Looks like there has been resection of the left sternocleidomastoid muscle. Evaluation of the neck tissue is limited without intravenous contrast. There is edema along the left side of the neck. Disc levels: There is marked disc space narrowing at C5-C6 with endplate changes.  Broad-based disc osteophyte complex at C5-C6. Mild disc space narrowing at C4-C5 and C6-C7. There appears to be a left paracentral disc herniation or disc osteophyte complex at C6-C7. Upper chest: Emphysematous changes at the lung apices. Appears to be focal scarring at the left lung apex. Other: None IMPRESSION: No acute intracranial abnormality. Focal low density in the right frontal white matter may represent an old insult. Bilateral nasal bone fractures and fracture of the nasal septum. Extensive soft tissue swelling and hematoma formation at the nose. No acute bone abnormality in the cervical spine with multilevel degenerative changes, particularly at C5-C6. Evidence for postsurgical changes on the left side of the neck with subcutaneous edema which could be related to postoperative change. Emphysematous changes at the lung apices with presumed scarring at the left lung apex. Focal lucency involving the left mandibular ramus is nonspecific. Cannot  exclude a focal erosion in this area. There is also a small bone fragment or chip at the left mandibular condyle which is age indeterminate. Electronically Signed   By: Markus Daft M.D.   On: 03/04/2016 08:33   Ct Cervical Spine Wo Contrast  Result Date: 03/04/2016 CLINICAL DATA:  [place] and landed on face. Obvious nasal deformity. History of cancer. EXAM: CT HEAD WITHOUT CONTRAST CT MAXILLOFACIAL WITHOUT CONTRAST CT CERVICAL SPINE WITHOUT CONTRAST TECHNIQUE: Multidetector CT imaging of the head, cervical spine, and maxillofacial structures were performed using the standard protocol without intravenous contrast. Multiplanar CT image reconstructions of the cervical spine and maxillofacial structures were also generated. COMPARISON:  None. FINDINGS: CT HEAD FINDINGS Brain: No evidence for acute hemorrhage, mass lesion, midline shift, hydrocephalus or large infarct. There is focal low-density in the right frontal white matter which is nonspecific but suggestive for an  old insult. Vascular: No hyperdense vessel or unexpected calcification. Skull: Nasal bone fractures. Refer to the facial CT for further characterization. No calvarial fracture. Other: None. CT MAXILLOFACIAL FINDINGS Osseous: There bilateral nasal bones fractures. The right nasal bone fracture demonstrates posterior displacement or depression. The anterior aspect of the nasal septum is fractured. Pterygoid plates appear to be intact. Zygomatic arches are intact. Mandibular condyles are located. There is focal bone loss involving the left mandibular ramus. Cannot exclude focal erosion at this site. There is a small bone fragment or chip involving the left mandibular condyle which may be chronic. There is erosion or fracture of the remaining right lower molar. Evidence for a caries involving the most posterior left lower tooth. Patient is missing multiple teeth. Degenerative disc and endplate changes in the cervical spine. Prevertebral soft tissues are within normal limits. Orbits: Orbital bones are intact. Sinuses: Minimal mucosal thickening in the anterior ethmoid air cells. Small polyp in the right maxillary sinus. Mastoid air cells are aerated. Soft tissues: Normal appearance of both globes. Soft tissue swelling at the nose and the appears to be a hematoma along the anterior right side of the nose. There is subcutaneous edema in the left submandibular region. There is edema along the left side of the neck. Parapharyngeal fat planes appear to be preserved. CT CERVICAL SPINE FINDINGS Alignment: Mild straightening of the cervical spine. Skull base and vertebrae: Negative for an acute fracture or dislocation. Soft tissues and spinal canal: Evidence for prior surgery on the left side of the neck with subcutaneous stranding or edema. Looks like there has been resection of the left sternocleidomastoid muscle. Evaluation of the neck tissue is limited without intravenous contrast. There is edema along the left side of the  neck. Disc levels: There is marked disc space narrowing at C5-C6 with endplate changes. Broad-based disc osteophyte complex at C5-C6. Mild disc space narrowing at C4-C5 and C6-C7. There appears to be a left paracentral disc herniation or disc osteophyte complex at C6-C7. Upper chest: Emphysematous changes at the lung apices. Appears to be focal scarring at the left lung apex. Other: None IMPRESSION: No acute intracranial abnormality. Focal low density in the right frontal white matter may represent an old insult. Bilateral nasal bone fractures and fracture of the nasal septum. Extensive soft tissue swelling and hematoma formation at the nose. No acute bone abnormality in the cervical spine with multilevel degenerative changes, particularly at C5-C6. Evidence for postsurgical changes on the left side of the neck with subcutaneous edema which could be related to postoperative change. Emphysematous changes at the lung apices with presumed scarring at  the left lung apex. Focal lucency involving the left mandibular ramus is nonspecific. Cannot exclude a focal erosion in this area. There is also a small bone fragment or chip at the left mandibular condyle which is age indeterminate. Electronically Signed   By: Markus Daft M.D.   On: 03/04/2016 08:33   Dg Chest Port 1 View  Result Date: 03/04/2016 CLINICAL DATA:  Fall.  Chest pain. EXAM: PORTABLE CHEST 1 VIEW COMPARISON:  None. FINDINGS: The heart size and mediastinal contours are within normal limits. Both lungs are clear. No pneumothorax or pleural effusion is noted. Bilateral axillary surgical clips are noted. The visualized skeletal structures are unremarkable. IMPRESSION: No acute cardiopulmonary abnormality seen. Electronically Signed   By: Marijo Conception, M.D.   On: 03/04/2016 08:33   Ct Maxillofacial Wo Contrast  Result Date: 03/04/2016 CLINICAL DATA:  [place] and landed on face. Obvious nasal deformity. History of cancer. EXAM: CT HEAD WITHOUT CONTRAST CT  MAXILLOFACIAL WITHOUT CONTRAST CT CERVICAL SPINE WITHOUT CONTRAST TECHNIQUE: Multidetector CT imaging of the head, cervical spine, and maxillofacial structures were performed using the standard protocol without intravenous contrast. Multiplanar CT image reconstructions of the cervical spine and maxillofacial structures were also generated. COMPARISON:  None. FINDINGS: CT HEAD FINDINGS Brain: No evidence for acute hemorrhage, mass lesion, midline shift, hydrocephalus or large infarct. There is focal low-density in the right frontal white matter which is nonspecific but suggestive for an old insult. Vascular: No hyperdense vessel or unexpected calcification. Skull: Nasal bone fractures. Refer to the facial CT for further characterization. No calvarial fracture. Other: None. CT MAXILLOFACIAL FINDINGS Osseous: There bilateral nasal bones fractures. The right nasal bone fracture demonstrates posterior displacement or depression. The anterior aspect of the nasal septum is fractured. Pterygoid plates appear to be intact. Zygomatic arches are intact. Mandibular condyles are located. There is focal bone loss involving the left mandibular ramus. Cannot exclude focal erosion at this site. There is a small bone fragment or chip involving the left mandibular condyle which may be chronic. There is erosion or fracture of the remaining right lower molar. Evidence for a caries involving the most posterior left lower tooth. Patient is missing multiple teeth. Degenerative disc and endplate changes in the cervical spine. Prevertebral soft tissues are within normal limits. Orbits: Orbital bones are intact. Sinuses: Minimal mucosal thickening in the anterior ethmoid air cells. Small polyp in the right maxillary sinus. Mastoid air cells are aerated. Soft tissues: Normal appearance of both globes. Soft tissue swelling at the nose and the appears to be a hematoma along the anterior right side of the nose. There is subcutaneous edema in the  left submandibular region. There is edema along the left side of the neck. Parapharyngeal fat planes appear to be preserved. CT CERVICAL SPINE FINDINGS Alignment: Mild straightening of the cervical spine. Skull base and vertebrae: Negative for an acute fracture or dislocation. Soft tissues and spinal canal: Evidence for prior surgery on the left side of the neck with subcutaneous stranding or edema. Looks like there has been resection of the left sternocleidomastoid muscle. Evaluation of the neck tissue is limited without intravenous contrast. There is edema along the left side of the neck. Disc levels: There is marked disc space narrowing at C5-C6 with endplate changes. Broad-based disc osteophyte complex at C5-C6. Mild disc space narrowing at C4-C5 and C6-C7. There appears to be a left paracentral disc herniation or disc osteophyte complex at C6-C7. Upper chest: Emphysematous changes at the lung apices. Appears to be  focal scarring at the left lung apex. Other: None IMPRESSION: No acute intracranial abnormality. Focal low density in the right frontal white matter may represent an old insult. Bilateral nasal bone fractures and fracture of the nasal septum. Extensive soft tissue swelling and hematoma formation at the nose. No acute bone abnormality in the cervical spine with multilevel degenerative changes, particularly at C5-C6. Evidence for postsurgical changes on the left side of the neck with subcutaneous edema which could be related to postoperative change. Emphysematous changes at the lung apices with presumed scarring at the left lung apex. Focal lucency involving the left mandibular ramus is nonspecific. Cannot exclude a focal erosion in this area. There is also a small bone fragment or chip at the left mandibular condyle which is age indeterminate. Electronically Signed   By: Markus Daft M.D.   On: 03/04/2016 08:33    EKG: Independently reviewed. Sinus, No ACS,   Assessment/Plan Active Problems:    Altered mental status   Syncope   Metastatic melanoma ([place])   Extensive facial fractures ([facility])   Bipolar 1 disorder ([facility])   Hypothyroid   Acute respiratory failure ([facility])   PTSD (post-traumatic stress disorder)   AMS: unknown etiology. Suspect multifactorial including drug abuse, severe concussion, polypharmacy, stroke, metastatic melanoma (including brain), and infection, and ETOH use. UDS  + for benzo/opioid/THC. Known h/o drug abuse. CXR unremarkable. UA abnormal w/ nitrites but likely from contamintated sample. Endorses ETOH use the night prior to episode but alcohol level <5 on admission.  - Tele - UCX/BCX - Ceftriaxone - lactic acid - Depakote level - Decrease by 1/2 home narcotics (MS contin 100 Q12) - PRN narcan - MRI brain (h/o melanoma mets and CT w/ possible acute process) - Neuro checks  Facial fractures: sustained from fall - enarging hematoma of the nose, nasal bone fracture and of nasal septum, possible L mandibular ramus fracture. - management per maxillofacial - consulted by EDP.   Hypoxemia: typically only wen pt resting in bed. Attempts to breath through nose which is swollen and clogged. Extensive smoking history.  - O2 PRN  Metastatic melanoma: followed at [facility]. Mets to brain. Excision of lesions in neck and hand.  - continue outpt therapy at [facility]  Hypothyroid: - continue synthroid  Bipolar/PTSD: unsure of baseline - continue dapakote, Seroquel,   DVT prophylaxis: SCD  Code Status: full - presumed  Family Communication: none - no contact information available Disposition Plan: pending improvement in mental status and evaluation by maxillofacial  Consults called: maxillofacial  Admission status: inpt    MERRELL, DAVID J MD [facility]  If 7PM-7AM, please contact night-coverage [email or web address] Password [facility]  03/04/2016, 3:18 PM

## 2016-03-04 NOTE — ED Notes (Signed)
Pt pulled out IV, acting out with staff, biting and scratching. IM Ativan and PO Geodon given

## 2016-03-04 NOTE — Progress Notes (Signed)
CRITICAL VALUE ALERT  Critical value received: troponin 0.74 and lactic acid 2.0  Date of notification: 03/04/16  Time of notification: 1709  Critical value read back: yes  Nurse who received alert: Lonn Georgia  MD notified (1st page): Dr. Marily Memos  Time of first page:  1810  MD notified (2nd page):   Time of second page:  Responding MD:  Dr. Marily Memos  Time MD responded:  [phone]

## 2016-03-04 NOTE — ED Notes (Signed)
Assisted Vicente Males, RN with an in and out cath; urine sample was obtained and sent to lab for testing; helped patient reposition herself on stretcher

## 2016-03-04 NOTE — ED Notes (Signed)
Patient physically moved from A8 to A9; patient placed back on continuous pulse oximetry, blood pressure cuff and oxygen NRB (15L)

## 2016-03-04 NOTE — ED Notes (Signed)
Patient placed on bedpan to use

## 2016-03-04 NOTE — Progress Notes (Signed)
Notified the on call  NP K. Schorr of the completed EKG

## 2016-03-05 ENCOUNTER — Inpatient Hospital Stay (HOSPITAL_COMMUNITY): Payer: Medicaid Other

## 2016-03-05 DIAGNOSIS — L8990 Pressure ulcer of unspecified site, unspecified stage: Secondary | ICD-10-CM | POA: Diagnosis present

## 2016-03-05 LAB — TROPONIN I
TROPONIN I: 0.32 ng/mL — AB (ref <0.03)
Troponin I: 0.18 ng/mL (ref <0.03)

## 2016-03-05 LAB — GLUCOSE, CAPILLARY: Glucose-Capillary: 63 mg/dL — ABNORMAL LOW (ref 65–99)

## 2016-03-05 LAB — URINE CULTURE

## 2016-03-05 MED ORDER — LORAZEPAM 2 MG/ML IJ SOLN
1.0000 mg | Freq: Once | INTRAMUSCULAR | Status: AC
  Administered 2016-03-05: 1 mg via INTRAVENOUS
  Filled 2016-03-05: qty 1

## 2016-03-05 MED ORDER — LORAZEPAM 2 MG/ML IJ SOLN
1.0000 mg | Freq: Once | INTRAMUSCULAR | Status: AC
  Administered 2016-03-06: 1 mg via INTRAVENOUS
  Filled 2016-03-05: qty 1

## 2016-03-05 MED ORDER — DEXTROSE 5 % IV SOLN
1.0000 g | INTRAVENOUS | Status: DC
  Administered 2016-03-06: 1 g via INTRAVENOUS
  Filled 2016-03-05 (×2): qty 10

## 2016-03-05 MED ORDER — THIAMINE HCL 100 MG/ML IJ SOLN
500.0000 mg | Freq: Every day | INTRAVENOUS | Status: DC
  Filled 2016-03-05 (×2): qty 5
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
## 2016-03-05 NOTE — Progress Notes (Signed)
PT Cancellation Note  Patient Details Name: Natilynn Durie MRN: [id number] DOB: 12/26/1965   Cancelled Treatment:    Reason Eval/Treat Not Completed: Patient at procedure or test/unavailable. Pt off of floor for MRI. PT will continue to f/u with pt as appropriate and available.    Clearnce Sorrel Ryhanna Duns[place] 03/05/2016, 10:41 AM Sherie Don, PT, DPT [phone]

## 2016-03-05 NOTE — Progress Notes (Signed)
Pt agitated and crying. Pt trying to go home to her mom and grandchild. She is getting upset and says she has to go. RN given scheduled pain med and prn ativan. Nothing has calmed her down. Txt paged. See orders.

## 2016-03-05 NOTE — Care Management Note (Signed)
Case Management Note  Patient Details  Name: Yolanda Krueger MRN: [id number] Date of Birth: 1965-09-19  Subjective/Objective:             Per nursing report patient continues to be disoriented unable to clearly/ correctly state name.  The following information was retrieved from records in Chart Review. Patient from home and lives with a roommate "[name]" whose call back number is [phone]. 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 Per chart review patient's roommate spoke with care providers over the phone, patient unable to talk as it was reported by him that she was sleeping on several occasions. He declined [facility] services stating he was taking good care of her and she was eating and drinking. It is noted that patient ran out of opioids on 11/27, day prior to admission, RN asked for patient to call her back for refill. Patient +benzos, opioids, THC on admission.       PCP [facility] [place], [place] [address]   Action/Plan:  CSW consulted for complex social needs for patient without noted contacts and unable to provide information. CM will continue to follow for discharge planning.    Expected Discharge Date:                  Expected Discharge Plan:     In-House Referral:     Discharge planning Services  CM Consult  Post Acute Care Choice:    Choice offered to:     DME Arranged:    DME Agency:     HH Arranged:    HH Agency:     Status of Service:  In process, will continue to follow  If discussed at Long Length of Stay Meetings, dates discussed:    Additional Comments:  Carles Collet, RN 03/05/2016, 12:00 PM
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
## 2016-03-05 NOTE — Progress Notes (Signed)
Pt had visitor "Lisabeth Devoid" come to see her in room. Pt was calm at first then became very agitated and walking out of the room ,saying she needed to go home. Pt was confused about her location, very restless, frightened and uncooperative. Pt was walked in hallway with PT/OT and some nurses. Pt calmed a little, but then became very excitable at the site of "Lisabeth Devoid." At first Pt seemed scared of him, pointing and saying "please don't let him hurt me." Pt then stated she wanted him to take her home. "Lisabeth Devoid" helped Pt back to room with staff, where Pt was put back in bed. Pt calmed a little but still very agitated. Pt given 1mg  Ativan with little relief. Pt continued to be agitated and uncooperative. "Lisabeth Devoid" left. MD, SW and CM all aware. Will continue to monitor and evaluate.
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
## 2016-03-05 NOTE — Progress Notes (Signed)
Pt switched from room 5w16 to 5w27 and made XXX.
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
## 2016-03-06 ENCOUNTER — Inpatient Hospital Stay (HOSPITAL_COMMUNITY): Payer: Medicaid Other

## 2016-03-06 DIAGNOSIS — R410 Disorientation, unspecified: Secondary | ICD-10-CM | POA: Diagnosis present

## 2016-03-06 DIAGNOSIS — F319 Bipolar disorder, unspecified: Secondary | ICD-10-CM

## 2016-03-06 DIAGNOSIS — F17210 Nicotine dependence, cigarettes, uncomplicated: Secondary | ICD-10-CM

## 2016-03-06 DIAGNOSIS — C7931 Secondary malignant neoplasm of brain: Secondary | ICD-10-CM | POA: Diagnosis present

## 2016-03-06 DIAGNOSIS — R4182 Altered mental status, unspecified: Secondary | ICD-10-CM

## 2016-03-06 DIAGNOSIS — F4310 Post-traumatic stress disorder, unspecified: Secondary | ICD-10-CM

## 2016-03-06 DIAGNOSIS — Z79899 Other long term (current) drug therapy: Secondary | ICD-10-CM

## 2016-03-06 DIAGNOSIS — I679 Cerebrovascular disease, unspecified: Secondary | ICD-10-CM | POA: Diagnosis present

## 2016-03-06 LAB — CBC
HEMATOCRIT: 31.8 % — AB (ref 36.0–46.0)
HEMOGLOBIN: 10.2 g/dL — AB (ref 12.0–15.0)
MCH: 29.2 pg (ref 26.0–34.0)
MCHC: 32.1 g/dL (ref 30.0–36.0)
MCV: 91.1 fL (ref 78.0–100.0)
Platelets: 202 10*3/uL (ref 150–400)
RBC: 3.49 MIL/uL — ABNORMAL LOW (ref 3.87–5.11)
RDW: 13.9 % (ref 11.5–15.5)
WBC: 7.1 10*3/uL (ref 4.0–10.5)

## 2016-03-06 LAB — BASIC METABOLIC PANEL
Anion gap: 6 (ref 5–15)
BUN: 13 mg/dL (ref 6–20)
CALCIUM: 8.6 mg/dL — AB (ref 8.9–10.3)
CHLORIDE: 102 mmol/L (ref 101–111)
CO2: 31 mmol/L (ref 22–32)
CREATININE: 0.59 mg/dL (ref 0.44–1.00)
GFR calc non Af Amer: >60 mL/min (ref >60)
Glucose, Bld: 87 mg/dL (ref 65–99)
Potassium: 3.7 mmol/L (ref 3.5–5.1)
Sodium: 139 mmol/L (ref 135–145)

## 2016-03-06 LAB — MAGNESIUM: Magnesium: 1.9 mg/dL (ref 1.7–2.4)

## 2016-03-06 LAB — GLUCOSE, CAPILLARY: Glucose-Capillary: 87 mg/dL (ref 65–99)

## 2016-03-06 LAB — TROPONIN I: TROPONIN I: 0.1 ng/mL — AB (ref <0.03)

## 2016-03-06 MED ORDER — VITAMIN B-1 100 MG PO TABS
500.0000 mg | ORAL_TABLET | Freq: Every day | ORAL | Status: DC
  Administered 2016-03-06 – 2016-03-08 (×3): 500 mg via ORAL
  Filled 2016-03-06 (×3): qty 5

## 2016-03-06 MED ORDER — LORAZEPAM 2 MG/ML IJ SOLN
INTRAMUSCULAR | Status: AC
  Filled 2016-03-06: qty 1

## 2016-03-06 MED ORDER — HALOPERIDOL LACTATE 5 MG/ML IJ SOLN
2.0000 mg | INTRAMUSCULAR | Status: DC
  Administered 2016-03-06 – 2016-03-07 (×4): 2 mg via INTRAVENOUS
  Filled 2016-03-06 (×4): qty 1

## 2016-03-06 MED ORDER — LORAZEPAM 2 MG/ML IJ SOLN
1.0000 mg | INTRAMUSCULAR | Status: DC | PRN
  Administered 2016-03-07: 1 mg via INTRAVENOUS
  Filled 2016-03-06: qty 1

## 2016-03-06 NOTE — Progress Notes (Signed)
EEG started, pt became very confused and agitated. She did end up pulling off all electrodes before the test was over. Dr Shon Hale in room and witnessed the confusion/agitation.

## 2016-03-06 NOTE — Progress Notes (Signed)
EEG attempt was made but not successful. Pt agitated and uncooperative at this time. Will re attempt as schedule permits.
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
## 2016-03-06 NOTE — Procedures (Signed)
Electroencephalogram (EEG) Report  Date of study: 03/06/16  Requesting clinician: Melba Coon, MD  Reason for study: r/o seizure  Brief clinical history: This is a 55-yo woman with episodic confusion. EEG for further evaluation.  Medications:  Current Facility-Administered Medications:  .  0.9 %  sodium chloride infusion, , Intravenous, Continuous, Waldemar Dickens, MD, Last Rate: 75 mL/hr at 03/04/16 1626 .  acetaminophen (TYLENOL) tablet 650 mg, 650 mg, Oral, Q6H PRN, 650 mg at 03/06/16 0245 **OR** acetaminophen (TYLENOL) suppository 650 mg, 650 mg, Rectal, Q6H PRN, Waldemar Dickens, MD .  cefTRIAXone (ROCEPHIN) 1 g in dextrose 5 % 50 mL IVPB, 1 g, Intravenous, Q24H, [name], MD .  divalproex (DEPAKOTE) DR tablet 250 mg, 250 mg, Oral, QHS, Waldemar Dickens, MD .  haloperidol lactate (HALDOL) injection 2 mg, 2 mg, Intravenous, Q4H, Marliss Coots, PA-C, 2 mg at 03/06/16 1323 .  levothyroxine (SYNTHROID, LEVOTHROID) tablet 125 mcg, 125 mcg, Oral, QAC breakfast, Waldemar Dickens, MD, 125 mcg at 03/06/16 1013 .  LORazepam (ATIVAN) 2 MG/ML injection, , , ,  .  LORazepam (ATIVAN) injection 1 mg, 1 mg, Intravenous, Q4H PRN, [name], MD .  morphine (MS CONTIN) 12 hr tablet 60 mg, 60 mg, Oral, Q12H, Waldemar Dickens, MD, 60 mg at 03/06/16 1014 .  naloxone [facility]) injection 0.4 mg, 0.4 mg, Intravenous, PRN, Waldemar Dickens, MD .  nicotine (NICODERM CQ - dosed in mg/24 hours) patch 14 mg, 14 mg, Transdermal, Daily, Waldemar Dickens, MD, 14 mg at 03/06/16 1017 .  polyethylene glycol (MIRALAX / GLYCOLAX) packet 17 g, 17 g, Oral, BID PRN, Waldemar Dickens, MD .  prochlorperazine (COMPAZINE) tablet 10 mg, 10 mg, Oral, Q6H PRN, Waldemar Dickens, MD .  QUEtiapine (SEROQUEL) tablet 150 mg, 150 mg, Oral, Daily, Waldemar Dickens, MD, 150 mg at 03/06/16 1012 .  senna (SENOKOT) tablet 17.2 mg, 2 tablet, Oral, BID, Waldemar Dickens, MD, 17.2 mg at 03/06/16 1014 .  thiamine (VITAMIN B-1) tablet 500 mg, 500  mg, Oral, Daily, [name], MD, 500 mg at 03/06/16 1323  Description: This is a routine EEG performed using standard international 10-20 electrode placement. A total of 18 channels are recorded, including one for the EKG. Wakefulness is recorded. This study was limited by the patient's agitation. It was terminated after five minutes of recording after the patient removed the electrodes.   Activating Maneuvers: None  Findings:  The EKG channel demonstrates a regular rhythm with a rate of 90 beats per minute.   The background consists of theta activity with good reactivity. This is symmetric and reacts as expected with eye opening.   There are no focal asymmetries. No epileptiform discharges are present. No seizures are recorded.    Impression: This limited EEG shows mild diffuse slowing that is nonspecific and that is consistent with a global encephalopathic process. No seizures or epileptiform activity were observed on this limited study.    Melba Coon, MD [facility]

## 2016-03-06 NOTE — Consult Note (Signed)
[facility] Face-to-Face Psychiatry Consult   Reason for Consult:  AMS and recent fall Referring Physician:  Dr. Bonner Puna Patient Identification: Yolanda Krueger MRN:  [id number] Principal Diagnosis: <principal problem not specified> Diagnosis:   Patient Active Problem List   Diagnosis Date Noted  . Pressure injury of skin [L89.90] 03/05/2016  . Altered mental status [R41.82] 03/04/2016  . Syncope [R55] 03/04/2016  . Metastatic melanoma ([place]) [C79.9] 03/04/2016  . Extensive facial fractures ([place]) [S02.92XA] 03/04/2016  . Bipolar 1 disorder ([place]) [F31.9] 03/04/2016  . Hypothyroid [E03.9] 03/04/2016  . Acute respiratory failure ([place]) [J96.00] 03/04/2016  . PTSD (post-traumatic stress disorder) [F43.10] 03/04/2016  . Closed fracture of nasal bones [S02.2XXA]   . Confusion [R41.0]     Total Time spent with patient: 45 minutes  Subjective:   Yolanda Krueger is a 50 y.o. female patient admitted with AMS.  HPI:  Yolanda Krueger is a 50 y.o., female seen, chart reviewed for face to face psychiatric consultation and evaluation of AMS and recent fall. Patient has been awake, confused, anxious, oriented to her name only and repeatedly asking to go home but failed to give details. She is poor historian and currently getting ready for EEG scan and staff at bed side. 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 She has a psychiatric history of PTSD, possible bipolar, polysubstance abuse and major depression. She is a patient at [facility] and received psychiatric medication management. She had been psychiatrically stable on her seroquel and depakote. She hashypothyroidism and treated with levothryoxin at 100 mcg. She has been following up with her oncology team. She was treated with duloxetine, seroquel and clonazepam. Reportedly She suppose to be admitted at [facility] but some how she end up moving to local area and had a fall and  required this hospitalization as her roommate/care giver. She does have strained relationship with her son and daughter.   Past Psychiatric History: PTSD, bipolar II, polysubstance abuse and major depression  Risk to Self: Is patient at risk for suicide?: No Risk to Others:   Prior Inpatient Therapy:   Prior Outpatient Therapy:    Past Medical History:  Past Medical History:  Diagnosis Date  . Bipolar 1 disorder ([place])   . Cancer ([place])   . Hypothyroid   . Malignant melanoma, metastatic ([place])   . PTSD (post-traumatic stress disorder)     Past Surgical History:  Procedure Laterality Date  . MELANOMA EXCISION     Family History:  Family History  Problem Relation Age of Onset  . Family history unknown: Yes   Family Psychiatric  History: Non contributory Social History:  History  Alcohol use Not on file     History  Drug use: Unknown    Social History   Social History  . Marital status: Single    Spouse name: N/A  . Number of children: N/A  . Years of education: N/A   Social History Main Topics  . Smoking status: Current Every Day Smoker    Packs/day: 1.00    Types: Cigarettes  . Smokeless tobacco: Never Used  . Alcohol use None  . Drug use: Unknown  . Sexual activity: Not Asked   Other Topics Concern  . None   Social History Narrative  . None   Additional Social History:    Allergies:   Allergies  Allergen Reactions  . Penicillins Rash    Labs:  Results for  orders placed or performed during the hospital encounter of 03/04/16 (from the past 48 hour(s))  Phosphorus     Status: Abnormal   Collection Time: 03/04/16  3:58 PM  Result Value Ref Range   Phosphorus 1.9 (L) 2.5 - 4.6 mg/dL  Magnesium     Status: None   Collection Time: 03/04/16  3:58 PM  Result Value Ref Range   Magnesium 2.2 1.7 - 2.4 mg/dL  TSH     Status: None   Collection Time: 03/04/16  3:58 PM  Result Value Ref Range   TSH 1.578 0.350 - 4.500 uIU/mL    Comment: Performed by a  3rd Generation assay with a functional sensitivity of <=0.01 uIU/mL.  Troponin I     Status: Abnormal   Collection Time: 03/04/16  3:58 PM  Result Value Ref Range   Troponin I 0.74 (HH) <0.03 ng/mL    Comment: CRITICAL RESULT CALLED TO, READ BACK BY AND VERIFIED WITH: K PRICE,RN 03/04/2016 1709 WBOND   Valproic acid level     Status: Abnormal   Collection Time: 03/04/16  3:58 PM  Result Value Ref Range   Valproic Acid Lvl 25 (L) 50.0 - 100.0 ug/mL  Lactic acid, plasma     Status: Abnormal   Collection Time: 03/04/16  3:58 PM  Result Value Ref Range   Lactic Acid, Venous 2.0 (HH) 0.5 - 1.9 mmol/L    Comment: CRITICAL RESULT CALLED TO, READ BACK BY AND VERIFIED WITH: K PRICE,RN 1709 03/04/2016 WBOND   Lactic acid, plasma     Status: None   Collection Time: 03/04/16  6:08 PM  Result Value Ref Range   Lactic Acid, Venous 0.7 0.5 - 1.9 mmol/L  Troponin I     Status: Abnormal   Collection Time: 03/04/16  6:45 PM  Result Value Ref Range   Troponin I 0.56 (HH) <0.03 ng/mL    Comment: CRITICAL VALUE NOTED.  VALUE IS CONSISTENT WITH PREVIOUSLY REPORTED AND CALLED VALUE.  CK     Status: Abnormal   Collection Time: 03/04/16  6:45 PM  Result Value Ref Range   Total CK 670 (H) 38 - 234 U/L  Troponin I     Status: Abnormal   Collection Time: 03/05/16 12:32 AM  Result Value Ref Range   Troponin I 0.32 (HH) <0.03 ng/mL    Comment: CRITICAL VALUE NOTED.  VALUE IS CONSISTENT WITH PREVIOUSLY REPORTED AND CALLED VALUE.  Glucose, capillary     Status: Abnormal   Collection Time: 03/05/16  8:12 AM  Result Value Ref Range   Glucose-Capillary 63 (L) 65 - 99 mg/dL  Troponin I     Status: Abnormal   Collection Time: 03/05/16 12:17 PM  Result Value Ref Range   Troponin I 0.18 (HH) <0.03 ng/mL    Comment: CRITICAL VALUE NOTED.  VALUE IS CONSISTENT WITH PREVIOUSLY REPORTED AND CALLED VALUE.  Troponin I     Status: Abnormal   Collection Time: 03/06/16  4:28 AM  Result Value Ref Range   Troponin I  0.10 (HH) <0.03 ng/mL    Comment: CRITICAL VALUE NOTED.  VALUE IS CONSISTENT WITH PREVIOUSLY REPORTED AND CALLED VALUE.  CBC     Status: Abnormal   Collection Time: 03/06/16  4:28 AM  Result Value Ref Range   WBC 7.1 4.0 - 10.5 K/uL   RBC 3.49 (L) 3.87 - 5.11 MIL/uL   Hemoglobin 10.2 (L) 12.0 - 15.0 g/dL   HCT 31.8 (L) 36.0 - 46.0 %   MCV  91.1 78.0 - 100.0 fL   MCH 29.2 26.0 - 34.0 pg   MCHC 32.1 30.0 - 36.0 g/dL   RDW 13.9 11.5 - 15.5 %   Platelets 202 150 - 400 K/uL  Basic metabolic panel     Status: Abnormal   Collection Time: 03/06/16  4:28 AM  Result Value Ref Range   Sodium 139 135 - 145 mmol/L   Potassium 3.7 3.5 - 5.1 mmol/L   Chloride 102 101 - 111 mmol/L   CO2 31 22 - 32 mmol/L   Glucose, Bld 87 65 - 99 mg/dL   BUN 13 6 - 20 mg/dL   Creatinine, Ser 0.59 0.44 - 1.00 mg/dL   Calcium 8.6 (L) 8.9 - 10.3 mg/dL   GFR calc non Af Amer >60 >60 mL/min   GFR calc Af Amer >60 >60 mL/min    Comment: (NOTE) The eGFR has been calculated using the CKD EPI equation. This calculation has not been validated in all clinical situations. eGFR's persistently <60 mL/min signify possible Chronic Kidney Disease.    Anion gap 6 5 - 15  Magnesium     Status: None   Collection Time: 03/06/16  4:28 AM  Result Value Ref Range   Magnesium 1.9 1.7 - 2.4 mg/dL  Glucose, capillary     Status: None   Collection Time: 03/06/16  7:46 AM  Result Value Ref Range   Glucose-Capillary 87 65 - 99 mg/dL    Current Facility-Administered Medications  Medication Dose Route Frequency Provider Last Rate Last Dose  . 0.9 %  sodium chloride infusion   Intravenous Continuous Waldemar Dickens, MD 75 mL/hr at 03/04/16 1626    . acetaminophen (TYLENOL) tablet 650 mg  650 mg Oral Q6H PRN Waldemar Dickens, MD   650 mg at 03/06/16 0245   Or  . acetaminophen (TYLENOL) suppository 650 mg  650 mg Rectal Q6H PRN Waldemar Dickens, MD      . cefTRIAXone (ROCEPHIN) 1 g in dextrose 5 % 50 mL IVPB  1 g Intravenous Q24H [name], MD      . divalproex (DEPAKOTE) DR tablet 250 mg  250 mg Oral QHS Waldemar Dickens, MD      . haloperidol lactate (HALDOL) injection 2 mg  2 mg Intravenous Q4H Marliss Coots, PA-C   2 mg at 03/06/16 1323  . levothyroxine (SYNTHROID, LEVOTHROID) tablet 125 mcg  125 mcg Oral QAC breakfast Waldemar Dickens, MD   125 mcg at 03/06/16 1013  . LORazepam (ATIVAN) injection 1 mg  1 mg Intravenous Q6H PRN Waldemar Dickens, MD   1 mg at 03/06/16 1018  . LORazepam (ATIVAN) injection 1 mg  1 mg Intravenous Once [name], MD      . morphine (MS CONTIN) 12 hr tablet 60 mg  60 mg Oral Q12H Waldemar Dickens, MD   60 mg at 03/06/16 1014  . naloxone [facility]) injection 0.4 mg  0.4 mg Intravenous PRN Waldemar Dickens, MD      . nicotine (NICODERM CQ - dosed in mg/24 hours) patch 14 mg  14 mg Transdermal Daily Waldemar Dickens, MD   14 mg at 03/06/16 1017  . ondansetron (ZOFRAN) tablet 4 mg  4 mg Oral Q6H PRN Waldemar Dickens, MD       Or  . ondansetron [facility]) injection 4 mg  4 mg Intravenous Q6H PRN Waldemar Dickens, MD      . polyethylene glycol (  MIRALAX / GLYCOLAX) packet 17 g  17 g Oral BID PRN Waldemar Dickens, MD      . prochlorperazine (COMPAZINE) tablet 10 mg  10 mg Oral Q6H PRN Waldemar Dickens, MD      . QUEtiapine (SEROQUEL) tablet 150 mg  150 mg Oral Daily Waldemar Dickens, MD   150 mg at 03/06/16 1012  . senna (SENOKOT) tablet 17.2 mg  2 tablet Oral BID Waldemar Dickens, MD   17.2 mg at 03/06/16 1014  . sodium chloride flush (NS) 0.9 % injection 3 mL  3 mL Intravenous Q12H Waldemar Dickens, MD   3 mL at 03/06/16 1000  . thiamine (VITAMIN B-1) tablet 500 mg  500 mg Oral Daily [name], MD   500 mg at 03/06/16 1323    Musculoskeletal: Strength & Muscle Tone: decreased Gait & Station: unable to stand Patient leans: N/A  Psychiatric Specialty Exam: Physical Exam as per history and physical  Review of Systems  Unable to perform ROS: Mental status change     Blood pressure 115/68, pulse 92,  temperature 98.6 F (37 C), temperature source Oral, resp. rate 19, weight 57.9 kg (127 lb 11.2 oz), SpO2 98 %.There is no height or weight on file to calculate BMI.  General Appearance: Bizarre, Disheveled and Guarded, facial superficial laceration noted, probably due to recent fall on her face.  Eye Contact:  Fair  Speech:  Garbled and Slow  Volume:  Decreased  Mood:  Anxious  Affect:  Inappropriate  Thought Process:  Disorganized and Irrelevant  Orientation:  Other:  oriented to her first name only.  Thought Content:  Illogical and Tangential  Suicidal Thoughts:  No  Homicidal Thoughts:  No  Memory:  Immediate;   Poor Recent;   Poor  Judgement:  Poor  Insight:  Lacking  Psychomotor Activity:  Restlessness  Concentration:  Concentration: Poor and Attention Span: Poor  Recall:  Poor  Fund of Knowledge:  Poor  Language:  Fair  Akathisia:  Negative  Handed:  Right  AIMS (if indicated):     Assets:  Others:  TBD  ADL's:  Impaired  Cognition:  Impaired,  Severe  Sleep:        Treatment Plan Summary: 50 years old female with multiple medical (metastatic melanoma and hypothyroidism) and psychiatric diagnoses including polysubstance abuse admitted with AMS vs acute encephalopathy and recent fall on her face. She has been diagnosed with Bipolar II, PTSD, Depression, and polysubstance abuse. Her UDS is positive for benzo's and THC. BAL is less than 5. She was recently found drinking alcohol. Patient continue to be with altered mental status at this time.  Based on my evaluation patient does not meet criteria for capacity to make her medical decisions at this time. She may need re--evaluation when reached medical stabilization or if changes her mental status  She has no family at bed side and CM has been in contact with her roommate / caregiver  Continue Air cabin crew for fall and fall precautions  Monitor for alcohol withdrawal symptoms including hallucination or seizures or  DT's  Continue Seroquel 150 mg at bed time and Depakote DR 250 mg Qhs for mood  Continue Ativan for possible detox needs or anxiety  Appreciate psychiatric consultation and we sign off as of today Please contact [phone] or [phone] if needs further assistance  Disposition: No evidence of imminent risk to self or others at present.   Supportive therapy provided about ongoing  stressors.  Ambrose Finland, MD 03/06/2016 2:24 PM
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
## 2016-03-06 NOTE — Progress Notes (Signed)
OT Cancellation Note  Patient Details Name: Yolanda Krueger MRN: [id number] DOB: 12/10/1965   Cancelled Treatment:    Reason Eval/Treat Not Completed: Patient at procedure or test/ unavailable.  Pt undergoing EEG.  [place], OTR/L [id number]   Lucille Passy M 03/06/2016, 2:44 PM
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
## 2016-03-07 ENCOUNTER — Inpatient Hospital Stay (HOSPITAL_COMMUNITY): Payer: Medicaid Other

## 2016-03-07 DIAGNOSIS — R55 Syncope and collapse: Secondary | ICD-10-CM

## 2016-03-07 LAB — ECHOCARDIOGRAM COMPLETE: Weight: 1974.4 oz

## 2016-03-07 LAB — GLUCOSE, CAPILLARY: GLUCOSE-CAPILLARY: 78 mg/dL (ref 65–99)

## 2016-03-07 MED ORDER — HALOPERIDOL LACTATE 5 MG/ML IJ SOLN: 2.0000 mg | INTRAMUSCULAR | Status: DC | PRN

## 2016-03-07 MED ORDER — INFLUENZA VAC SPLIT QUAD 0.5 ML IM SUSY
0.5000 mL | PREFILLED_SYRINGE | INTRAMUSCULAR | Status: AC
  Administered 2016-03-08: 0.5 mL via INTRAMUSCULAR
  Filled 2016-03-07: qty 0.5

## 2016-03-07 NOTE — Progress Notes (Signed)
Tx documented and performed by SPTA under direct guidance and supervision of PTA at all times.  Charting reviewed for accuracy and depicts tx performed and services provided.  Governor Rooks, PTA pager [phone]  Physical Therapy Treatment Patient Details Name: Yolanda Krueger MRN: [id number] DOB: [birth year] Today's Date: 03/07/2016    History of Present Illness Pt is a 50 y/o female admitted secondary to sustaining a fall at home and AMS. 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    PT Comments    The pt has met her goals.  She ambulated without an assisted device with only mild instability this session.  Pt displays good righting responses, but maintains a slow gait for age.  Informed supervising PT of patient progress and PT in agreement to change recommendations.  Will inform supervising PT to address goals.    Follow Up Recommendations  No PT follow up     Equipment Recommendations  None recommended by PT    Recommendations for Other Services       Precautions / Restrictions Precautions Precautions: Fall Restrictions Weight Bearing Restrictions: No    Mobility  Bed Mobility Overal bed mobility: Modified Independent Bed Mobility: Supine to Sit;Sit to Supine     Supine to sit: Modified independent (Device/Increase time);HOB elevated Sit to supine: Modified independent (Device/Increase time)   General bed mobility comments: Pt required increased time.  no assistance.  Transfers Overall transfer level: Modified independent Equipment used: None Transfers: Sit to/from Stand Sit to Stand: Modified independent (Device/Increase time)         General transfer comment: Increased time for safety.  Ambulation/Gait Ambulation/Gait assistance: Supervision Ambulation Distance (Feet): 320 Feet Assistive device: None Gait Pattern/deviations: Step-through pattern;Decreased  stride length Gait velocity: decreased Gait velocity interpretation: Below normal speed for age/gender General Gait Details: Pt still ambulated with instability, but no true LOB and pt able to demonstrate good righting responses with no assistance needed.  Verbal cues needed for reciprocal arm swing and for increased stride length.   Stairs            Wheelchair Mobility    Modified Rankin (Stroke Patients Only)       Balance     Sitting balance-Leahy Scale: Fair       Standing balance-Leahy Scale: Fair                      Cognition Arousal/Alertness: Awake/alert   Overall Cognitive Status:  (Difficult to assess with strange family dynamics.)   Orientation Level: Disoriented to;Place;Time     Following Commands: Follows one step commands inconsistently     Problem Solving: Decreased initiation;Slow processing;Difficulty sequencing;Requires verbal cues;Requires tactile cues      Exercises      General Comments        Pertinent Vitals/Pain Pain Assessment: No/denies pain    Home Living                      Prior Function            PT Goals (current goals can now be found in the care plan section) Acute Rehab PT Goals Patient Stated Goal: to go home PT Goal Formulation: Patient unable to participate in goal setting Time For Goal Achievement: 03/19/16 Potential to Achieve Goals: Fair Progress towards PT goals: Goals met/education completed, patient discharged from PT    Frequency  Min 3X/week      PT Plan Discharge plan needs to be updated    Co-evaluation             End of Session Equipment Utilized During Treatment: Gait belt Activity Tolerance: Patient tolerated treatment well Patient left: in bed;with call bell/phone within reach;with bed alarm set;with nursing/sitter in room     Time: 1253-1309 PT Time Calculation (min) (ACUTE ONLY): 16 min  Charges:  $Gait Training: 8-22 mins                    G  Codes:      Bary Castilla 03-25-2016, 4:09 PM  Rito Ehrlich. [place], [place]

## 2016-03-07 NOTE — Progress Notes (Signed)
Neurology Progress Note  Subjective: No significant change in her mentation overnight. She is in four-point soft limb restraints this morning due to her agitation. She is complaining of pain in the fingers of her left hand and wants me to remove her restraints for her. She is asking for Yolanda Krueger. Her sustained attention is poor, limiting ROS.   Current Meds:   Current Facility-Administered Medications:  .  0.9 %  sodium chloride infusion, , Intravenous, Continuous, Waldemar Dickens, MD, Last Rate: 75 mL/hr at 03/04/16 1626 .  acetaminophen (TYLENOL) tablet 650 mg, 650 mg, Oral, Q6H PRN, 650 mg at 03/06/16 0245 **OR** acetaminophen (TYLENOL) suppository 650 mg, 650 mg, Rectal, Q6H PRN, Waldemar Dickens, MD .  cefTRIAXone (ROCEPHIN) 1 g in dextrose 5 % 50 mL IVPB, 1 g, Intravenous, Q24H, [name], MD, 1 g at 03/06/16 2045 .  divalproex (DEPAKOTE) DR tablet 250 mg, 250 mg, Oral, QHS, Waldemar Dickens, MD, 250 mg at 03/06/16 2101 .  haloperidol lactate (HALDOL) injection 2 mg, 2 mg, Intravenous, Q4H, Marliss Coots, PA-C, 2 mg at 03/07/16 0815 .  levothyroxine (SYNTHROID, LEVOTHROID) tablet 125 mcg, 125 mcg, Oral, QAC breakfast, Waldemar Dickens, MD, 125 mcg at 03/07/16 0827 .  LORazepam (ATIVAN) injection 1 mg, 1 mg, Intravenous, Q4H PRN, [name], MD, 1 mg at 03/07/16 0114 .  morphine (MS CONTIN) 12 hr tablet 60 mg, 60 mg, Oral, Q12H, Waldemar Dickens, MD, 60 mg at 03/07/16 [id number] .  naloxone [facility]) injection 0.4 mg, 0.4 mg, Intravenous, PRN, Waldemar Dickens, MD .  nicotine (NICODERM CQ - dosed in mg/24 hours) patch 14 mg, 14 mg, Transdermal, Daily, Waldemar Dickens, MD, 14 mg at 03/07/16 [id number] .  polyethylene glycol (MIRALAX / GLYCOLAX) packet 17 g, 17 g, Oral, BID PRN, Waldemar Dickens, MD .  prochlorperazine (COMPAZINE) tablet 10 mg, 10 mg, Oral, Q6H PRN, Waldemar Dickens, MD .  QUEtiapine (SEROQUEL) tablet 150 mg, 150 mg, Oral, Daily, Waldemar Dickens, MD, 150 mg at 03/07/16 0829 .  senna (SENOKOT)  tablet 17.2 mg, 2 tablet, Oral, BID, Waldemar Dickens, MD, 17.2 mg at 03/07/16 [id number] .  thiamine (VITAMIN B-1) tablet 500 mg, 500 mg, Oral, Daily, [name], MD, 500 mg at 03/07/16 0828  Objective:  Temp:  [98.3 F (36.8 C)-98.6 F (37 C)] 98.3 F (36.8 C) (12/01 0532) Pulse Rate:  [68-95] 68 (12/01 0532) Resp:  [16-20] 16 (12/01 0532) BP: (115-134)/(68-78) 134/78 (12/01 0532) SpO2:  [97 %-100 %] 100 % (12/01 0532) Weight:  [56 kg (123 lb 6.4 oz)] 56 kg (123 lb 6.4 oz) (12/01 0500)  General: WDWN restrained in bed. She is alert, oriented to self and [facility] only. She is able to follow some midline and appendicular commands, requiring redirection frequently due to her poor sustained attention. Affect is labile and overall irritable. Speech is mildly dysarthria. No aphasia.  HEENT: Neck is supple without lymphadenopathy. Mucous membranes are slightly dry. Ecchymoses noted around both eyes and her nose appears swollen. Sclerae are anicteric. There is no conjunctival injection.  CV: Regular, 2/6 murmur present. Carotid pulses are 2+ and symmetric with no bruits. Distal pulses 2+ and symmetric.  Lungs: CTAB  Extremities: No C/C/E. Soft limb restraints in place x4. Neuro: MS: As noted above. No aphasia.  CN: Pupils are equal and reactive from 3-->2 mm bilaterally. She blinks to threat x4. EOMI, no nystagmus. Face is symmetric at rest with normal strength and  mobility. Hearing appears intact to conversational voice. Voice is normal in tone and quality. Uvula is midline. Bilateral SCM and trapezii are 5/5. Tongue protrudes to midline.  Motor: Normal bulk, tone, and strength throughout. No tremor or other abnormal movements are observed.  Sensation: Appears intact to light touch. DTRs: Brisk 2+, symmetric. Toes are downgoing bilaterally. No pathological reflexes.  Coordination and gait: Deferred at this time as she is in four-point restraints.   Labs: Lab Results  Component Value Date    WBC 7.1 03/06/2016   HGB 10.2 (L) 03/06/2016   HCT 31.8 (L) 03/06/2016   PLT 202 03/06/2016   GLUCOSE 87 03/06/2016   ALT 20 03/04/2016   AST 47 (H) 03/04/2016   NA 139 03/06/2016   K 3.7 03/06/2016   CL 102 03/06/2016   CREATININE 0.59 03/06/2016   BUN 13 03/06/2016   CO2 31 03/06/2016   TSH 1.578 03/04/2016   CBC Latest Ref Rng & Units 03/06/2016 03/04/2016  WBC 4.0 - 10.5 K/uL 7.1 9.1  Hemoglobin 12.0 - 15.0 g/dL 10.2(L) 11.8(L)  Hematocrit 36.0 - 46.0 % 31.8(L) 37.4  Platelets 150 - 400 K/uL 202 188    No results found for: HGBA1C Lab Results  Component Value Date   ALT 20 03/04/2016   AST 47 (H) 03/04/2016   ALKPHOS 79 03/04/2016   BILITOT 0.7 03/04/2016    Radiology:  There is no new neuroimaging for review  A/P:   1. Acute delirium: This is most likely multifactorial in etiology with potential contributions from polysubstance abuse, EtOH/benzo withdrawal, possible UTI, and underlying psychiatric illness. Agree that concussion could also be contributing given recent fall with facial trauma. She did not allow the EEG to be completed but the limited study did not show any epileptiform activity or seizures. MRI did not show any evidence of acute intracranial pathology to explain her presentation. Continue to optimize metabolic status as you are. Continue to treat any underlying infection. Minimize the use of opiates, benzos or any medication with strong anticholinergic properties as much as possible. Optimize sleep-wake cycles as much as you can by keeping the room bright with activity during the day and dark and quiet at night. For agitation, could consider making quetiapine 150 mg BID and using haloperidol PRN instead of scheduled. An alternative to these would be olanzapine 2.5-5 mg bid or tid.   2. Metastatic melanoma: She has a h/o a metastatic lesion to the right frontal lobe that was treated with radiation. MRI showed focal encephalomalacia in that region. This could  predispose to encephalopathy and delirium from all causes and could predict delayed recovery from same. No evidence of acute intracranial disease is noted on imaging.   No family present at the bedside.   Melba Coon, MD [facility]

## 2016-03-07 NOTE — Consult Note (Signed)
Reason for Consult: Facial trauma Referring Physician: Dr. Linna Darner  Yolanda Krueger is an 50 y.o. female.  HPI: The patient is a 50 yrs old female here after what appears to be a fall and trauma to her face.  She was initially seen in the ED and admitted with confusion. 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  She has family and a care giver.  There was a report of drinking the night prior to the fall but no other recent fevers, SOB or emesis.  She has swelling and bruising around the eyes and nose.  She complains of not being able to breath out of her nose well.  She is in hand restraints at the time of the exam.  She did not answer questions but did say she was cold and was given a blanket. The CT confirms a nasal septal fracture.  Past Medical History:  Diagnosis Date  . Bipolar 1 disorder ([place])   . Cancer ([place])   . Hypothyroid   . Malignant melanoma, metastatic ([place])   . PTSD (post-traumatic stress disorder)     Past Surgical History:  Procedure Laterality Date  . MELANOMA EXCISION      Family History  Problem Relation Age of Onset  . Family history unknown: Yes    Social History:  reports that she has been smoking Cigarettes.  She has been smoking about 1.00 pack per day. She has never used smokeless tobacco. Her alcohol and drug histories are not on file.  Allergies:  Allergies  Allergen Reactions  . Penicillins Rash    Medications: I have reviewed the patient's current medications.  Results for orders placed or performed during the hospital encounter of 03/04/16 (from the past 48 hour(s))  Glucose, capillary     Status: Abnormal   Collection Time: 03/05/16  8:12 AM  Result Value Ref Range   Glucose-Capillary 63 (L) 65 - 99 mg/dL  Troponin I     Status: Abnormal   Collection Time: 03/05/16 12:17 PM  Result Value Ref Range   Troponin I 0.18 (HH) <0.03 ng/mL    Comment: CRITICAL VALUE NOTED.  VALUE IS CONSISTENT WITH PREVIOUSLY REPORTED AND CALLED VALUE.  Troponin I     Status: Abnormal   Collection Time: 03/06/16  4:28 AM  Result Value Ref Range   Troponin I 0.10 (HH) <0.03 ng/mL    Comment: CRITICAL VALUE NOTED.  VALUE IS CONSISTENT WITH PREVIOUSLY REPORTED AND CALLED VALUE.  CBC     Status: Abnormal   Collection Time: 03/06/16  4:28 AM  Result Value Ref Range   WBC 7.1 4.0 - 10.5 K/uL   RBC 3.49 (L) 3.87 - 5.11 MIL/uL   Hemoglobin 10.2 (L) 12.0 - 15.0 g/dL   HCT 31.8 (L) 36.0 - 46.0 %   MCV 91.1 78.0 - 100.0 fL   MCH 29.2 26.0 - 34.0 pg   MCHC 32.1 30.0 - 36.0 g/dL   RDW 13.9 11.5 - 15.5 %   Platelets 202 150 - 400 K/uL  Basic metabolic panel     Status: Abnormal   Collection Time: 03/06/16  4:28 AM  Result Value Ref Range   Sodium 139 135 - 145 mmol/L   Potassium 3.7 3.5 - 5.1 mmol/L   Chloride 102 101 - 111 mmol/L   CO2 31 22 - 32 mmol/L   Glucose, Bld 87 65 -  99 mg/dL   BUN 13 6 - 20 mg/dL   Creatinine, Ser 0.59 0.44 - 1.00 mg/dL   Calcium 8.6 (L) 8.9 - 10.3 mg/dL   GFR calc non Af Amer >60 >60 mL/min   GFR calc Af Amer >60 >60 mL/min    Comment: (NOTE) The eGFR has been calculated using the CKD EPI equation. This calculation has not been validated in all clinical situations. eGFR's persistently <60 mL/min signify possible Chronic Kidney Disease.    Anion gap 6 5 - 15  Magnesium     Status: None   Collection Time: 03/06/16  4:28 AM  Result Value Ref Range   Magnesium 1.9 1.7 - 2.4 mg/dL  Glucose, capillary     Status: None   Collection Time: 03/06/16  7:46 AM  Result Value Ref Range   Glucose-Capillary 87 65 - 99 mg/dL  Glucose, capillary     Status: None   Collection Time: 03/07/16  5:15 AM  Result Value Ref Range   Glucose-Capillary 78 65 - 99 mg/dL    Mr Brain Wo Contrast  Result Date: 03/05/2016 CLINICAL DATA:  History of metastatic melanoma with brain metastasis.  Intermittent confusion. EXAM: MRI HEAD WITHOUT CONTRAST TECHNIQUE: Multiplanar, multiecho pulse sequences of the brain and surrounding structures were obtained without intravenous contrast. COMPARISON:  CT 03/04/2016 FINDINGS: The study is abbreviated because of patient motion and lack of cooperation. No contrast was administered. Brain: Diffusion imaging does not show any acute or subacute infarction or other cause of restricted diffusion. There is an old small vessel insult within the left side of the pons. No cerebellar abnormality. Cerebral hemispheres show minimal chronic small-vessel disease of the white matter. There is an edema pattern in the right frontal subcortical white matter. There could be a metastasis in this region, though this is not specifically defined. If the patient's condition allows, contrast-enhanced scanning would be recommended. No other sign of intracranial metastatic disease. No hydrocephalus or extra-axial collection. Vascular: Major vessels at the base of the brain show flow. Skull and upper cervical spine: Negative Sinuses/Orbits: Clear/normal Other: None significant IMPRESSION: Edema pattern in the right frontal white matter could herald the presence of an underlying metastasis. The examination is abbreviated because of poor patient tolerance. Contrast-enhanced scanning would be recommended if the patient's condition can be improved. Electronically Signed   By: Nelson Chimes M.D.   On: 03/05/2016 11:43    Review of Systems  Unable to perform ROS: Mental acuity   Blood pressure 134/78, pulse 68, temperature 98.3 F (36.8 C), temperature source Oral, resp. rate 16, weight 56 kg (123 lb 6.4 oz), SpO2 100 %. Physical Exam  Constitutional: She appears well-developed and well-nourished.  HENT:  Head: Normocephalic.  Eyes: EOM are normal. Pupils are equal, round, and reactive to light.  Cardiovascular: Normal rate.   Respiratory: Effort normal. No respiratory distress. She has  no wheezes.  GI: Soft. She exhibits no distension. There is no tenderness.  Neurological: She is alert.  Skin: Skin is warm.  Psychiatric: She has a normal mood and affect. Her behavior is normal. Judgment and thought content normal.    Assessment/Plan: Nasal septal fracture. Will plan to repair in next week or two but can do as an outpatient.    Wallace Going 03/07/2016, 7:56 AM
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
## 2016-03-07 NOTE — Progress Notes (Addendum)
D/C documented by Bary Castilla and cosigned by Governor Rooks.  Will inform supervising PT to attest to documentation.   Governor Rooks, PTA pager [phone]  Physical Therapy Discharge Patient Details Name: Yolanda Krueger MRN: [id number] DOB: 1965-12-29 Today's Date: 03/07/2016 Time: [id number] PT Time Calculation (min) (ACUTE ONLY): 16 min  Patient discharged from PT services secondary to meeting all goals and appearing at baseline.  Please see latest therapy progress note for current level of functioning and progress toward goals.    Progress and discharge plan discussed with patient and/or caregiver: yes  GP     Bary Castilla 03/07/2016, 4:10 PM

## 2016-03-08 DIAGNOSIS — S0292XA Unspecified fracture of facial bones, initial encounter for closed fracture: Secondary | ICD-10-CM

## 2016-03-08 DIAGNOSIS — E039 Hypothyroidism, unspecified: Secondary | ICD-10-CM

## 2016-03-08 DIAGNOSIS — C7931 Secondary malignant neoplasm of brain: Secondary | ICD-10-CM

## 2016-03-08 DIAGNOSIS — L02512 Cutaneous abscess of left hand: Secondary | ICD-10-CM | POA: Diagnosis present

## 2016-03-08 DIAGNOSIS — R410 Disorientation, unspecified: Secondary | ICD-10-CM

## 2016-03-08 DIAGNOSIS — C799 Secondary malignant neoplasm of unspecified site: Secondary | ICD-10-CM

## 2016-03-08 DIAGNOSIS — J9601 Acute respiratory failure with hypoxia: Secondary | ICD-10-CM

## 2016-03-08 LAB — GLUCOSE, CAPILLARY
GLUCOSE-CAPILLARY: 85 mg/dL (ref 65–99)
GLUCOSE-CAPILLARY: 99 mg/dL (ref 65–99)

## 2016-03-08 MED ORDER — MORPHINE SULFATE ER 100 MG PO TBCR: 100.0000 mg | EXTENDED_RELEASE_TABLET | Freq: Every day | ORAL | 0 refills | Status: DC

## 2016-03-08 MED ORDER — DOXYCYCLINE HYCLATE 100 MG PO TABS: 100.0000 mg | ORAL_TABLET | Freq: Two times a day (BID) | ORAL | 0 refills | Status: DC

## 2016-03-08 MED ORDER — LIDOCAINE HCL (PF) 2 % IJ SOLN
0.0000 mL | Freq: Once | INTRAMUSCULAR | Status: DC | PRN
  Filled 2016-03-08: qty 20

## 2016-03-08 MED ORDER — MUPIROCIN 2 % EX OINT
TOPICAL_OINTMENT | CUTANEOUS | Status: AC
  Administered 2016-03-08: 12:00:00
  Filled 2016-03-08: qty 22

## 2016-03-08 MED ORDER — MUPIROCIN CALCIUM 2 % EX CREA
TOPICAL_CREAM | Freq: Every day | CUTANEOUS | Status: DC
  Filled 2016-03-08: qty 15

## 2016-03-08 MED ORDER — DOXYCYCLINE HYCLATE 100 MG PO TABS
100.0000 mg | ORAL_TABLET | Freq: Two times a day (BID) | ORAL | Status: DC
  Administered 2016-03-08: 100 mg via ORAL
  Filled 2016-03-08: qty 1

## 2016-03-08 NOTE — Progress Notes (Signed)
NURSING PROGRESS NOTE  Yolanda Krueger [id number] Discharge Data: 03/08/2016 12:35 PM Attending Provider: [name], MD PCP:No primary care provider on file.     [name] to be D/C'd Home per MD order.  Discussed with the patient the After Visit Summary and all questions fully answered. All IV's discontinued with no bleeding noted. All belongings returned to patient for patient to take home.   Last Vital Signs:  Blood pressure 111/74, pulse 82, temperature 99.1 F (37.3 C), temperature source Oral, resp. rate 16, weight 56.6 kg (124 lb 12.5 oz), SpO2 96 %.  Discharge Medication List   Medication List    TAKE these medications   divalproex 250 MG DR tablet Commonly known as:  DEPAKOTE Take 250 mg by mouth at bedtime.   doxycycline 100 MG tablet Commonly known as:  VIBRA-TABS Take 1 tablet (100 mg total) by mouth every 12 (twelve) hours.   levothyroxine 125 MCG tablet Commonly known as:  SYNTHROID, LEVOTHROID Take 125 mcg by mouth daily before breakfast.   morphine 100 MG 12 hr tablet Commonly known as:  MS CONTIN Take 1 tablet (100 mg total) by mouth daily. What changed:  when to take this   naproxen 375 MG tablet Commonly known as:  NAPROSYN Take 375 mg by mouth 2 (two) times daily with a meal.   ondansetron 8 MG tablet Commonly known as:  ZOFRAN Take by mouth every 8 (eight) hours as needed for nausea or vomiting.   polyethylene glycol packet Commonly known as:  MIRALAX / GLYCOLAX Take 17 g by mouth 2 (two) times daily as needed for mild constipation.   prochlorperazine 10 MG tablet Commonly known as:  COMPAZINE Take 10 mg by mouth every 6 (six) hours as needed for nausea or vomiting.   QUEtiapine 100 MG tablet Commonly known as:  SEROQUEL Take 150 mg by mouth daily.   senna 8.6 MG Tabs tablet Commonly known as:  SENOKOT Take 2 tablets by mouth 2 (two) times daily.        Charolette Child, RN

## 2016-03-08 NOTE — Progress Notes (Signed)
Neurology Progress Note  Subjective: Chart reviewed. She is much improved this morning. She is sitting up in bed with her RN in the room. She complains on pain in her left hand and is waiting for pain medication from her RN. She is alert and oriented today but still has poor insight into her situation. She knows that she fell but is unsure why. 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 ROS is otherwise negative.   Current Meds:   Current Facility-Administered Medications:  .  0.9 %  sodium chloride infusion, , Intravenous, Continuous, Waldemar Dickens, MD, Last Rate: 75 mL/hr at 03/04/16 1626 .  acetaminophen (TYLENOL) tablet 650 mg, 650 mg, Oral, Q6H PRN, 650 mg at 03/06/16 0245 **OR** acetaminophen (TYLENOL) suppository 650 mg, 650 mg, Rectal, Q6H PRN, Waldemar Dickens, MD .  divalproex (DEPAKOTE) DR tablet 250 mg, 250 mg, Oral, QHS, Waldemar Dickens, MD, 250 mg at 03/07/16 2234 .  haloperidol lactate (HALDOL) injection 2 mg, 2 mg, Intravenous, Q4H PRN, [name], MD .  levothyroxine (SYNTHROID, LEVOTHROID) tablet 125 mcg, 125 mcg, Oral, QAC breakfast, Waldemar Dickens, MD, 125 mcg at 03/08/16 [id number] .  LORazepam (ATIVAN) injection 1 mg, 1 mg, Intravenous, Q4H PRN, [name], MD, 1 mg at 03/07/16 0114 .  morphine (MS CONTIN) 12 hr tablet 60 mg, 60 mg, Oral, Q12H, Waldemar Dickens, MD, 60 mg at 03/08/16 0900 .  naloxone [facility]) injection 0.4 mg, 0.4 mg, Intravenous, PRN, Waldemar Dickens, MD .  nicotine (NICODERM CQ - dosed in mg/24 hours) patch 14 mg, 14 mg, Transdermal, Daily, Waldemar Dickens, MD, 14 mg at 03/08/16 0815 .  polyethylene glycol (MIRALAX / GLYCOLAX) packet 17 g, 17 g, Oral, BID PRN, Waldemar Dickens, MD .  prochlorperazine (COMPAZINE) tablet 10 mg, 10 mg, Oral, Q6H PRN, Waldemar Dickens, MD .  QUEtiapine (SEROQUEL) tablet 150 mg, 150 mg, Oral, Daily, Waldemar Dickens, MD, 150 mg at 03/08/16 [id number] .  senna (SENOKOT) tablet  17.2 mg, 2 tablet, Oral, BID, Waldemar Dickens, MD, 17.2 mg at 03/08/16 [id number] .  thiamine (VITAMIN B-1) tablet 500 mg, 500 mg, Oral, Daily, [name], MD, 500 mg at 03/08/16 0815  Objective:  Temp:  [98 F (36.7 C)-99.1 F (37.3 C)] 99.1 F (37.3 C) (12/02 0605) Pulse Rate:  [75-95] 82 (12/02 0605) Resp:  [16-19] 16 (12/02 0605) BP: (92-114)/(43-75) 111/74 (12/02 0605) SpO2:  [95 %-100 %] 96 % (12/02 0605) Weight:  [56.6 kg (124 lb 12.5 oz)-57.2 kg (126 lb 1.6 oz)] 56.6 kg (124 lb 12.5 oz) (12/02 [id number])  General: WDWN resting comfortably in bed. She is alert, oriented x4. Speech is clear, no dysarthria. She has no aphasia. Sustained attention is good. She is mildly perseverative, specifically about going home, but can be redirected easily. Insight remains poor.  Affect is bright.  HEENT: Neck is supple without lymphadenopathy. Mucous membranes are slightly dry. Ecchymoses noted around both eyes and across her nose. Her nose remains swollen. Sclerae are anicteric. There is no conjunctival injection.  CV: Regular, 2/6 murmur present. Carotid pulses are 2+ and symmetric with no bruits. Distal pulses 2+ and symmetric.  Lungs: CTAB  Extremities: No C/C/E. Soft limb restraints in place x4. Neuro: MS: As noted above. No aphasia.  CN: Pupils are equal and reactive from 3-->2 mm bilaterally. Visual fields appear full bilaterally. EOMI with  some breakup of smooth pursuits, no nystagmus. Facial sensation is intact. Face is symmetric at rest with normal strength and mobility. Hearing appears intact to conversational voice. Voice is normal in tone and quality. Uvula is midline. Bilateral SCM and trapezii are 5/5. Tongue protrudes to midline.  Motor: Normal bulk, tone, and strength throughout. No tremor or other abnormal movements are observed.  Sensation: Appears intact to light touch. DTRs: Brisk 2+, symmetric. Toes are downgoing bilaterally. No pathological reflexes.  Coordination: FTN is without  dysmetria bilaterally.    Labs: Lab Results  Component Value Date   WBC 7.1 03/06/2016   HGB 10.2 (L) 03/06/2016   HCT 31.8 (L) 03/06/2016   PLT 202 03/06/2016   GLUCOSE 87 03/06/2016   ALT 20 03/04/2016   AST 47 (H) 03/04/2016   NA 139 03/06/2016   K 3.7 03/06/2016   CL 102 03/06/2016   CREATININE 0.59 03/06/2016   BUN 13 03/06/2016   CO2 31 03/06/2016   TSH 1.578 03/04/2016   CBC Latest Ref Rng & Units 03/06/2016 03/04/2016  WBC 4.0 - 10.5 K/uL 7.1 9.1  Hemoglobin 12.0 - 15.0 g/dL 10.2(L) 11.8(L)  Hematocrit 36.0 - 46.0 % 31.8(L) 37.4  Platelets 150 - 400 K/uL 202 188    No results found for: HGBA1C Lab Results  Component Value Date   ALT 20 03/04/2016   AST 47 (H) 03/04/2016   ALKPHOS 79 03/04/2016   BILITOT 0.7 03/04/2016    Radiology:  There is no new neuroimaging for review  A/P:   1. Acute delirium: This is most likely multifactorial in etiology with potential contributions from polysubstance abuse, EtOH/benzo withdrawal, possible UTI, and underlying psychiatric illness. This is much improved today. Continue to optimize metabolic status as you are. Continue to treat any underlying infection. Minimize the use of opiates, benzos or any medication with strong anticholinergic properties as much as possible. Optimize sleep-wake cycles as much as you can by keeping the room bright with activity during the day and dark and quiet at night.   2. Metastatic melanoma: She has a h/o a metastatic lesion to the right frontal lobe that was treated with radiation. MRI showed focal encephalomalacia in that region. This could predispose to encephalopathy and delirium from all causes and could predict delayed recovery from same. No evidence of acute intracranial disease is noted on imaging.   No family present at the bedside.    I have no additional recommendations and will sign off. Call if any new issues arise.   Melba Coon, MD [facility]

## 2016-03-08 NOTE — Procedures (Signed)
PROCEDURE NOTE: INCISION AND DRAINAGE OF LEFT 5TH FINGER TIP ABSCESS  Informed consent was obtained. A timeout protocol was performed prior to initiating the procedure. The area was prepared and draped in the usual, sterile manner with betadine. The site was anesthetized with 62ml of 2% lidocaine withOUT epinephrine. A linear stab incision along the local skin lines was made and scant purulence expressed. The abcess was probed with hemostat and sequestered pockets were opened. Bleeding was minimal. No packing was placed.   The patient tolerated the procedure well without complications.  Standard post-procedure care is explained and return precautions are given.  Vance Gather, MD 03/08/2016 12:01 PM

## 2016-03-08 NOTE — Discharge Summary (Signed)
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 focal asymmetries. No epileptiform discharges are present. No seizures are recorded.    Impression: This limited EEG shows mild diffuse slowing that is nonspecific and that is consistent with a global encephalopathic process. No seizures or epileptiform activity were observed on this limited study.     Subjective: Pt is alert, oriented, and able to converse today. Ambulating without assistance.   Discharge Exam: Vitals:   03/08/16 0222 03/08/16 0605  BP: 114/75 111/74  Pulse: 84 82  Resp: 16 16  Temp: 98.6 F (37 C) 99.1 F (37.3 C)   Vitals:   03/07/16 2241 03/08/16 0222 03/08/16 0511 03/08/16 0605  BP: 101/64 114/75  111/74  Pulse: 75 84  82  Resp: 18 16  16   Temp: 98.4 F (36.9 C) 98.6 F (37 C)  99.1 F (37.3 C)  TempSrc: Oral Oral  Oral  SpO2: 95% 95%  96%  Weight: 56.6 kg (124 lb 12.8 oz)  57.2 kg (126 lb 1.6 oz) 56.6 kg (124 lb 12.5 oz)   General: Pt is much more alert, conversant, in no distress HEENT: Nasal bridge significantly enlarged and tender to palpation. Left jaw tender from the angle of the mandible to  the TMJ. Scattered ecchymoses Cardiovascular: RRR, S1/S2 +, no gallops Respiratory: Nonlabored on room air, CTA bilaterally, no wheezing, no rhonchi Abdominal: Soft, NT, ND, bowel sounds + Extremities: no edema, no cyanosis.  Skin: Purulent superficial abscess on distal left 5th digit with surrounding erythema circumferentially without streaking. Also Nondraining nonerythematous abrasions on face, hand and leg. The results of significant diagnostics from this hospitalization (including imaging, microbiology, ancillary and laboratory) are listed below for reference.    Labs: Basic Metabolic Panel:  Recent Labs Lab 03/04/16 0623 03/04/16 1558 03/06/16 0428  NA 144  --  139  K 4.3  --  3.7  CL 98*  --  102  CO2 32  --  31  GLUCOSE 115*  --  87  BUN 29*  --  13  CREATININE 1.10*  --  0.59  CALCIUM 9.2  --  8.6*  MG  --  2.2 1.9  PHOS  --  1.9*  --    Liver Function Tests:  Recent Labs Lab 03/04/16 0623  AST 47*  ALT 20  ALKPHOS 79  BILITOT 0.7  PROT 7.0  ALBUMIN 3.9   CBC:  Recent Labs Lab 03/04/16 0623 03/06/16 0428  WBC 9.1 7.1  NEUTROABS 8.0*  --   HGB 11.8* 10.2*  HCT 37.4 31.8*  MCV 94.4 91.1  PLT 188 202   Cardiac Enzymes:  Recent Labs Lab 03/04/16 1558 03/04/16 1845 03/05/16 0032 03/05/16 1217 03/06/16 0428  CKTOTAL  --  670*  --   --   --   TROPONINI 0.74* 0.56* 0.32* 0.18* 0.10*   CBG:  Recent Labs Lab 03/05/16 0812 03/06/16 0746 03/07/16 0515 03/08/16 0510 03/08/16 0820  GLUCAP 63* 87 78 85 99   Urinalysis    Component Value Date/Time   COLORURINE AMBER (A) 03/04/2016 1007   APPEARANCEUR HAZY (A) 03/04/2016 1007   LABSPEC 1.026 03/04/2016 1007   PHURINE 5.5 03/04/2016 1007   GLUCOSEU NEGATIVE 03/04/2016 1007   HGBUR LARGE (A) 03/04/2016 1007   BILIRUBINUR SMALL (A) 03/04/2016 1007   KETONESUR NEGATIVE 03/04/2016 1007   PROTEINUR NEGATIVE 03/04/2016 1007   NITRITE POSITIVE (A) 03/04/2016 1007   LEUKOCYTESUR SMALL (A)  03/04/2016 1007   Microbiology Recent Results (from the past 240 hour(s))  Urine culture     Status: Abnormal   Collection Time: 03/04/16 10:07 AM  Result Value Ref Range Status   Specimen Description URINE, CLEAN CATCH  Final   Special Requests NONE  Final   Culture MULTIPLE SPECIES PRESENT, SUGGEST RECOLLECTION (A)  Final   Report Status 03/05/2016 FINAL  Final   Time coordinating discharge: Over 47 minutes  Vance Gather, MD  [facility] 03/08/2016, 12:03 PM Pager [phone]  If 7PM-7AM, please contact night-coverage [email or web address] Password [email or web address]

## 2016-06-12 ENCOUNTER — Encounter (HOSPITAL_COMMUNITY): Payer: Self-pay | Admitting: Emergency Medicine

## 2016-06-12 ENCOUNTER — Emergency Department (HOSPITAL_COMMUNITY)
Admission: EM | Admit: 2016-06-12 | Discharge: 2016-06-12 | Disposition: A | Discharge status: Home / Self Care | Payer: Medicaid Other | Attending: Emergency Medicine | Admitting: Emergency Medicine

## 2016-06-12 DIAGNOSIS — Z79899 Other long term (current) drug therapy: Secondary | ICD-10-CM | POA: Insufficient documentation

## 2016-06-12 DIAGNOSIS — Z85841 Personal history of malignant neoplasm of brain: Secondary | ICD-10-CM | POA: Insufficient documentation

## 2016-06-12 DIAGNOSIS — F17210 Nicotine dependence, cigarettes, uncomplicated: Secondary | ICD-10-CM | POA: Insufficient documentation

## 2016-06-12 DIAGNOSIS — R197 Diarrhea, unspecified: Secondary | ICD-10-CM | POA: Diagnosis not present

## 2016-06-12 DIAGNOSIS — R112 Nausea with vomiting, unspecified: Secondary | ICD-10-CM | POA: Insufficient documentation

## 2016-06-12 DIAGNOSIS — E039 Hypothyroidism, unspecified: Secondary | ICD-10-CM | POA: Diagnosis not present

## 2016-06-12 LAB — COMPREHENSIVE METABOLIC PANEL
ALBUMIN: 4.1 g/dL (ref 3.5–5.0)
ALT: 13 U/L — ABNORMAL LOW (ref 14–54)
ANION GAP: 12 (ref 5–15)
AST: 26 U/L (ref 15–41)
Alkaline Phosphatase: 89 U/L (ref 38–126)
BILIRUBIN TOTAL: 0.6 mg/dL (ref 0.3–1.2)
BUN: 20 mg/dL (ref 6–20)
CHLORIDE: 102 mmol/L (ref 101–111)
CO2: 26 mmol/L (ref 22–32)
Calcium: 9.6 mg/dL (ref 8.9–10.3)
Creatinine, Ser: 0.64 mg/dL (ref 0.44–1.00)
GFR calc Af Amer: >60 mL/min (ref >60)
GFR calc non Af Amer: >60 mL/min (ref >60)
GLUCOSE: 99 mg/dL (ref 65–99)
POTASSIUM: 3.2 mmol/L — AB (ref 3.5–5.1)
SODIUM: 140 mmol/L (ref 135–145)
TOTAL PROTEIN: 7.1 g/dL (ref 6.5–8.1)

## 2016-06-12 LAB — CBC
HEMATOCRIT: 35.7 % — AB (ref 36.0–46.0)
HEMOGLOBIN: 11.5 g/dL — AB (ref 12.0–15.0)
MCH: 28.9 pg (ref 26.0–34.0)
MCHC: 32.2 g/dL (ref 30.0–36.0)
MCV: 89.7 fL (ref 78.0–100.0)
Platelets: 226 10*3/uL (ref 150–400)
RBC: 3.98 MIL/uL (ref 3.87–5.11)
RDW: 14 % (ref 11.5–15.5)
WBC: 5.4 10*3/uL (ref 4.0–10.5)

## 2016-06-12 LAB — LIPASE, BLOOD: Lipase: 18 U/L (ref 11–51)

## 2016-06-12 MED ORDER — SODIUM CHLORIDE 0.9 % IV BOLUS (SEPSIS)
1000.0000 mL | Freq: Once | INTRAVENOUS | Status: AC
  Administered 2016-06-12: 1000 mL via INTRAVENOUS

## 2016-06-12 MED ORDER — MORPHINE SULFATE (PF) 4 MG/ML IV SOLN
4.0000 mg | Freq: Once | INTRAVENOUS | Status: AC
  Administered 2016-06-12: 4 mg via INTRAVENOUS
  Filled 2016-06-12: qty 1

## 2016-06-12 MED ORDER — KETOROLAC TROMETHAMINE 30 MG/ML IJ SOLN
30.0000 mg | Freq: Once | INTRAMUSCULAR | Status: AC
  Administered 2016-06-12: 30 mg via INTRAVENOUS
  Filled 2016-06-12: qty 1

## 2016-06-12 MED ORDER — ONDANSETRON HCL 4 MG/2ML IJ SOLN
4.0000 mg | Freq: Once | INTRAMUSCULAR | Status: AC
  Administered 2016-06-12: 4 mg via INTRAVENOUS
  Filled 2016-06-12: qty 2

## 2016-06-12 MED ORDER — LORAZEPAM 1 MG PO TABS
1.0000 mg | ORAL_TABLET | Freq: Once | ORAL | Status: AC
  Administered 2016-06-12: 1 mg via ORAL
  Filled 2016-06-12: qty 1

## 2016-06-12 MED ORDER — ONDANSETRON 8 MG PO TBDP: ORAL_TABLET | ORAL | 0 refills | Status: DC

## 2016-06-12 NOTE — ED Provider Notes (Signed)
[facility] DEPT Provider Note   CSN: [id number] Arrival date & time: 06/12/16  1712  By signing my name below, I, Ethelle Lyon Long, attest that this documentation has been prepared under the direction and in the presence of Veryl Speak, MD . Electronically Signed: Ethelle Lyon Long, Scribe. 06/12/2016. 6:11 PM.   History   Chief Complaint Chief Complaint  Patient presents with  . Emesis  . Fatigue   The history is provided by the patient and the EMS personnel. No language interpreter was used.  Emesis   This is a new problem. The current episode started 3 to 5 hours ago. The problem has not changed since onset.There has been no fever. Associated symptoms include abdominal pain. Pertinent negatives include no diarrhea and no fever.   HPI Comments:  Yolanda Krueger is a 51 y.o. female with a PMHx of CA, Hypothyroidism, and Bipolar 1 disorder, who presents to the Emergency Department by way of ambulance complaining of constant, vomiting onset 3:00PM this afternoon. Per EMS, pt was given 4mg  of Narcan by the family member PTA because they thought she was overdosing. She reports a couple of episodes of her emesis were "really red, but I didn't eat anything red". Pt has associated symptoms of nausea, generalized weakness, abdominal pain, and cramping left-sided neck pain. She states similar abdominal pain happened one year ago. Today's episode was brought on s/p eating a pot-pie. No sick contact with similar symptoms. She did not try anything for relief of her pain PTA. Pt denies fever, diarrhea, and any other complaints at this time. Pt is a current every day smoker.  Past Medical History:  Diagnosis Date  . Bipolar 1 disorder ([place])   . Cancer ([place])   . Hypothyroid   . Malignant melanoma, metastatic ([place])   . PTSD (post-traumatic stress disorder)     Patient Active Problem List   Diagnosis Date Noted  . Abscess of left little finger 03/08/2016  . Acute delirium   . Brain metastases  ([place])   . Cerebrovascular disease   . Pressure injury of skin 03/05/2016  . Altered mental status 03/04/2016  . Syncope 03/04/2016  . Metastatic melanoma ([place]) 03/04/2016  . Extensive facial fractures ([place]) 03/04/2016  . Bipolar 1 disorder ([place]) 03/04/2016  . Hypothyroid 03/04/2016  . Acute respiratory failure ([place]) 03/04/2016  . PTSD (post-traumatic stress disorder) 03/04/2016  . Closed fracture of nasal bones   . Confusion     Past Surgical History:  Procedure Laterality Date  . MELANOMA EXCISION      OB History    No data available       Home Medications    Prior to Admission medications   Medication Sig Start Date End Date Taking? Authorizing Provider  divalproex (DEPAKOTE) 250 MG DR tablet Take 250 mg by mouth at bedtime.    Historical Provider, MD  doxycycline (VIBRA-TABS) 100 MG tablet Take 1 tablet (100 mg total) by mouth every 12 (twelve) hours. 03/08/16   [name], MD  levothyroxine (SYNTHROID, LEVOTHROID) 125 MCG tablet Take 125 mcg by mouth daily before breakfast.    Historical Provider, MD  morphine (MS CONTIN) 100 MG 12 hr tablet Take 1 tablet (100 mg total) by mouth daily. 03/08/16   [name], MD  naproxen (NAPROSYN) 375 MG tablet Take 375 mg by mouth 2 (two) times daily with a meal.    Historical Provider, MD  ondansetron (ZOFRAN) 8 MG tablet Take by mouth every 8 (eight) hours as  needed for nausea or vomiting.    Historical Provider, MD  polyethylene glycol (MIRALAX / GLYCOLAX) packet Take 17 g by mouth 2 (two) times daily as needed for mild constipation.    Historical Provider, MD  prochlorperazine (COMPAZINE) 10 MG tablet Take 10 mg by mouth every 6 (six) hours as needed for nausea or vomiting.    Historical Provider, MD  QUEtiapine (SEROQUEL) 100 MG tablet Take 150 mg by mouth daily.    Historical Provider, MD  senna (SENOKOT) 8.6 MG TABS tablet Take 2 tablets by mouth 2 (two) times daily.    Historical Provider, MD    Family History Family History   Problem Relation Age of Onset  . Family history unknown: Yes    Social History Social History  Substance Use Topics  . Smoking status: Current Every Day Smoker    Packs/day: 1.00    Types: Cigarettes  . Smokeless tobacco: Never Used  . Alcohol use Not on file     Allergies   Penicillins   Review of Systems Review of Systems  Constitutional: Positive for fatigue. Negative for fever.  Gastrointestinal: Positive for abdominal pain, nausea and vomiting. Negative for diarrhea.  Musculoskeletal: Positive for neck pain.  Neurological: Positive for weakness (generalized).  All other systems reviewed and are negative.    Physical Exam Updated Vital Signs BP 140/93   Pulse 89   Temp 98.5 F (36.9 C) (Oral)   Resp 22   LMP  (LMP Unknown)   SpO2 99%   Physical Exam  Constitutional: She is oriented to person, place, and time. She appears well-developed and well-nourished. No distress.  Appears somewhat anxious and uncomfortable.   HENT:  Head: Normocephalic and atraumatic.  Mouth/Throat: Oropharynx is clear and moist. No oropharyngeal exudate.  Eyes: Conjunctivae are normal. Pupils are equal, round, and reactive to light. Right eye exhibits no discharge. Left eye exhibits no discharge. No scleral icterus.  Neck: Normal range of motion. Neck supple. No thyromegaly present.  Cardiovascular: Normal rate, regular rhythm, normal heart sounds and intact distal pulses.  Exam reveals no gallop and no friction rub.   No murmur heard. Pulmonary/Chest: Effort normal and breath sounds normal. No stridor. No respiratory distress. She has no wheezes. She has no rales.  Abdominal: Soft. Bowel sounds are normal. She exhibits no distension. There is tenderness. There is no rebound and no guarding.  There is mild tenderness in all four quadrants.   Musculoskeletal: Normal range of motion. She exhibits no edema.  Lymphadenopathy:    She has no cervical adenopathy.  Neurological: She is alert  and oriented to person, place, and time. Coordination normal.  Skin: Skin is warm and dry. No rash noted. She is not diaphoretic. No pallor.  Psychiatric: She has a normal mood and affect.  Nursing note and vitals reviewed.    ED Treatments / Results  DIAGNOSTIC STUDIES:  Oxygen Saturation is 93% on RA, low by my interpretation.    COORDINATION OF CARE:  6:05 PM Discussed treatment plan with pt at bedside including anti-emetics, blood work, and pt agreed to plan.  Labs (all labs ordered are listed, but only abnormal results are displayed) Labs Reviewed  LIPASE, BLOOD  COMPREHENSIVE METABOLIC PANEL  CBC  URINALYSIS, ROUTINE W REFLEX MICROSCOPIC    EKG  EKG Interpretation None       Radiology No results found.  Procedures Procedures (including critical care time)  Medications Ordered in ED Medications  LORazepam (ATIVAN) tablet 1 mg (1 mg  Oral Given 06/12/16 1756)     Initial Impression / Assessment and Plan / ED Course  I have reviewed the triage vital signs and the nursing notes.  Pertinent labs & imaging results that were available during my care of the patient were reviewed by me and considered in my medical decision making (see chart for details).  Patient presents here with nausea, vomiting, diarrhea. An acquaintance apparently gave her a dose of Narcan at home because she was sleeping soundly. It appears as though this may have precipitated some sort of withdraw that caused her symptoms. Either this, or she has a viral gastroenteritis. Either way she was observed for several hours in the ER. She was given IV fluids and medications and is feeling much better. Her laboratory studies are reassuring and I see no indication for admission or further workup. She will be discharged, to follow-up with her primary doctor.  Final Clinical Impressions(s) / ED Diagnoses   Final diagnoses:  None    New Prescriptions New Prescriptions   No medications on file   I  personally performed the services described in this documentation, which was scribed in my presence. The recorded information has been reviewed and is accurate.        Veryl Speak, MD 06/12/16 2020

## 2016-06-12 NOTE — Discharge Instructions (Signed)
Zofran as prescribed as needed for nausea.  Follow-up with your primary Dr. If symptoms are not improving in the next 2-3 days, and return to the ER symptoms significantly worsen or change.

## 2016-06-12 NOTE — ED Notes (Signed)
Pt placed on SPO2 and BP monitor

## 2016-06-12 NOTE — ED Triage Notes (Addendum)
Pt arrives via [facility] for c/o n/v and generalized weakness since this afternoon, per ems report, pt was given 4 of narcan prior to their arrival by family member, pt states significant other thought she was "overdosing." Pt a/o, resp e/u, nad.

## 2016-06-12 NOTE — ED Notes (Signed)
Patient reports feeling generalized body aches, N/V since 1500 today. She states that her friend saw her sleeping and thought that she took her pain medicine and gave her some Narcan that she has at the house while she was trying to sleep.

## 2016-07-07 ENCOUNTER — Emergency Department (HOSPITAL_COMMUNITY): Payer: Medicaid Other

## 2016-07-07 ENCOUNTER — Emergency Department (HOSPITAL_COMMUNITY)
Admission: EM | Admit: 2016-07-07 | Discharge: 2016-07-07 | Discharge status: Against Medical Advice | Payer: Medicaid Other | Attending: Emergency Medicine | Admitting: Emergency Medicine

## 2016-07-07 ENCOUNTER — Encounter (HOSPITAL_COMMUNITY): Payer: Self-pay

## 2016-07-07 DIAGNOSIS — R509 Fever, unspecified: Secondary | ICD-10-CM | POA: Insufficient documentation

## 2016-07-07 DIAGNOSIS — Z79899 Other long term (current) drug therapy: Secondary | ICD-10-CM | POA: Insufficient documentation

## 2016-07-07 DIAGNOSIS — M542 Cervicalgia: Secondary | ICD-10-CM | POA: Insufficient documentation

## 2016-07-07 DIAGNOSIS — F17210 Nicotine dependence, cigarettes, uncomplicated: Secondary | ICD-10-CM | POA: Insufficient documentation

## 2016-07-07 DIAGNOSIS — Z85841 Personal history of malignant neoplasm of brain: Secondary | ICD-10-CM | POA: Insufficient documentation

## 2016-07-07 DIAGNOSIS — M549 Dorsalgia, unspecified: Secondary | ICD-10-CM

## 2016-07-07 DIAGNOSIS — E039 Hypothyroidism, unspecified: Secondary | ICD-10-CM | POA: Diagnosis not present

## 2016-07-07 LAB — URINALYSIS, ROUTINE W REFLEX MICROSCOPIC
BILIRUBIN URINE: NEGATIVE
Glucose, UA: NEGATIVE mg/dL
HGB URINE DIPSTICK: NEGATIVE
Ketones, ur: NEGATIVE mg/dL
Leukocytes, UA: NEGATIVE
NITRITE: NEGATIVE
PH: 5 (ref 5.0–8.0)
Protein, ur: NEGATIVE mg/dL
SPECIFIC GRAVITY, URINE: 1.021 (ref 1.005–1.030)

## 2016-07-07 LAB — BASIC METABOLIC PANEL
ANION GAP: 9 (ref 5–15)
BUN: 29 mg/dL — ABNORMAL HIGH (ref 6–20)
CHLORIDE: 101 mmol/L (ref 101–111)
CO2: 31 mmol/L (ref 22–32)
Calcium: 9.8 mg/dL (ref 8.9–10.3)
Creatinine, Ser: 0.62 mg/dL (ref 0.44–1.00)
GFR calc non Af Amer: >60 mL/min (ref >60)
Glucose, Bld: 87 mg/dL (ref 65–99)
POTASSIUM: 3.6 mmol/L (ref 3.5–5.1)
SODIUM: 141 mmol/L (ref 135–145)

## 2016-07-07 LAB — CBC WITH DIFFERENTIAL/PLATELET
BASOS ABS: 0 10*3/uL (ref 0.0–0.1)
BASOS PCT: 0 %
EOS ABS: 0.2 10*3/uL (ref 0.0–0.7)
Eosinophils Relative: 5 %
HEMATOCRIT: 34.7 % — AB (ref 36.0–46.0)
HEMOGLOBIN: 11.7 g/dL — AB (ref 12.0–15.0)
Lymphocytes Relative: 33 %
Lymphs Abs: 1.5 10*3/uL (ref 0.7–4.0)
MCH: 30.1 pg (ref 26.0–34.0)
MCHC: 33.7 g/dL (ref 30.0–36.0)
MCV: 89.2 fL (ref 78.0–100.0)
MONOS PCT: 8 %
Monocytes Absolute: 0.4 10*3/uL (ref 0.1–1.0)
NEUTROS ABS: 2.4 10*3/uL (ref 1.7–7.7)
NEUTROS PCT: 54 %
Platelets: 221 10*3/uL (ref 150–400)
RBC: 3.89 MIL/uL (ref 3.87–5.11)
RDW: 14.1 % (ref 11.5–15.5)
WBC: 4.5 10*3/uL (ref 4.0–10.5)

## 2016-07-07 LAB — C-REACTIVE PROTEIN: CRP: <0.8 mg/dL (ref <1.0)

## 2016-07-07 LAB — SEDIMENTATION RATE: SED RATE: 12 mm/h (ref 0–22)

## 2016-07-07 MED ORDER — MORPHINE SULFATE ER 15 MG PO TBCR
15.0000 mg | EXTENDED_RELEASE_TABLET | Freq: Two times a day (BID) | ORAL | Status: DC
  Administered 2016-07-07: 15 mg via ORAL
  Filled 2016-07-07: qty 1

## 2016-07-07 NOTE — ED Notes (Signed)
Assisted patient to the restroom. I wheeled her down but she was able to move from bed to wheel chair without help

## 2016-07-07 NOTE — ED Triage Notes (Signed)
Pt states she woke up today with lower back pain. She reports she also has left neck pain. She also reports some nasal congestion with hx of pneumonia.

## 2016-07-07 NOTE — ED Provider Notes (Signed)
[facility] DEPT Provider Note    By signing my name below, I, Bea Graff, attest that this documentation has been prepared under the direction and in the presence of Forde Dandy, MD. Electronically Signed: Bea Graff, ED Scribe. 07/07/16. 1:48 PM.    History   Chief Complaint Chief Complaint  Patient presents with  . Back Pain   The history is provided by the patient and medical records. No language interpreter was used.    Yolanda Krueger is a 51 y.o. female with PMHx of Bipolar disorder, hypothyroid and malignant melanoma who presents to the Emergency Department complaining of neck and back pain that began today. She reports fever TMax 103 degrees yesterday. She reports nausea, vomiting and diarrhea. Pt reports coughing, rhinorrhea and congestion. Pt also reports some urine retention stating she feels as if she cannot urinate sometimes. She normally takes Oxycodone for pain but states she has been taken off of it because she thought she was getting better. Upon review of the chart pt had a pain contract that was terminated. She reports taking Motrin with no significant relief. She denies modifying factors. She denies bowel or bladder incontinence, numbness, tingling or weakness of the lower extremities, saddle anesthesia. She denies any recent trauma, injury or fall. Her oncologist, Dr. Harriet Masson, is at [facility] and has finished her treatment four months ago.   Past Medical History:  Diagnosis Date  . Bipolar 1 disorder ([place])   . Cancer ([place])   . Hypothyroid   . Malignant melanoma, metastatic ([place])   . PTSD (post-traumatic stress disorder)     Patient Active Problem List   Diagnosis Date Noted  . Abscess of left little finger 03/08/2016  . Acute delirium   . Brain metastases ([place])   . Cerebrovascular disease   . Pressure injury of skin 03/05/2016  . Altered mental status 03/04/2016  . Syncope 03/04/2016  . Metastatic melanoma ([place]) 03/04/2016  . Extensive facial  fractures ([place]) 03/04/2016  . Bipolar 1 disorder ([place]) 03/04/2016  . Hypothyroid 03/04/2016  . Acute respiratory failure ([place]) 03/04/2016  . PTSD (post-traumatic stress disorder) 03/04/2016  . Closed fracture of nasal bones   . Confusion     Past Surgical History:  Procedure Laterality Date  . MELANOMA EXCISION      OB History    No data available       Home Medications    Prior to Admission medications   Medication Sig Start Date End Date Taking? Authorizing Provider  divalproex (DEPAKOTE) 250 MG DR tablet Take 250 mg by mouth at bedtime.    Historical Provider, MD  doxycycline (VIBRA-TABS) 100 MG tablet Take 1 tablet (100 mg total) by mouth every 12 (twelve) hours. 03/08/16   [name], MD  levothyroxine (SYNTHROID, LEVOTHROID) 125 MCG tablet Take 125 mcg by mouth daily before breakfast.    Historical Provider, MD  morphine (MS CONTIN) 100 MG 12 hr tablet Take 1 tablet (100 mg total) by mouth daily. 03/08/16   [name], MD  naproxen (NAPROSYN) 375 MG tablet Take 375 mg by mouth 2 (two) times daily with a meal.    Historical Provider, MD  ondansetron (ZOFRAN ODT) 8 MG disintegrating tablet 8mg  ODT q4 hours prn nausea 06/12/16   Veryl Speak, MD  ondansetron (ZOFRAN) 8 MG tablet Take by mouth every 8 (eight) hours as needed for nausea or vomiting.    Historical Provider, MD  polyethylene glycol (MIRALAX / GLYCOLAX) packet Take 17 g by mouth 2 (  two) times daily as needed for mild constipation.    Historical Provider, MD  prochlorperazine (COMPAZINE) 10 MG tablet Take 10 mg by mouth every 6 (six) hours as needed for nausea or vomiting.    Historical Provider, MD  QUEtiapine (SEROQUEL) 100 MG tablet Take 150 mg by mouth daily.    Historical Provider, MD  senna (SENOKOT) 8.6 MG TABS tablet Take 2 tablets by mouth 2 (two) times daily.    Historical Provider, MD    Family History Family History  Problem Relation Age of Onset  . Family history unknown: Yes    Social  History Social History  Substance Use Topics  . Smoking status: Current Every Day Smoker    Packs/day: 1.00    Types: Cigarettes  . Smokeless tobacco: Never Used  . Alcohol use No     Allergies   Penicillins   Review of Systems Review of Systems  10/14 systems reviewed and are negative other than those stated in the HPI   Physical Exam Updated Vital Signs BP 106/65 (BP Location: Right Arm)   Pulse (!) 106   Temp 98.4 F (36.9 C) (Oral)   Resp 19   LMP  (LMP Unknown)   SpO2 99%   Physical Exam Physical Exam  Nursing note and vitals reviewed. Constitutional: Well developed, well nourished, non-toxic, and in no acute distress Head: Normocephalic and atraumatic.  Mouth/Throat: Oropharynx is clear and moist.  Neck: Normal range of motion. Neck supple.  Cardiovascular: Normal rate and regular rhythm.   Pulmonary/Chest: Effort normal and breath sounds normal.  Abdominal: Soft. There is no tenderness. There is no rebound and no guarding.  Musculoskeletal: Normal range of motion. Cervical and lumbar spine tenderness without deformities or step off. Neurological: Alert, no facial droop, fluent speech, moves all extremities symmetrically. Intact sensations in bilateral upper and lower extremities. Full strength in BLE. Normal rectal tone. Skin: Skin is warm and dry.  Psychiatric: Cooperative   ED Treatments / Results  DIAGNOSTIC STUDIES: Oxygen Saturation is 99% on RA, normal by my interpretation.   COORDINATION OF CARE: 1:43 PM- Will order dose of pain medication. Pt verbalizes understanding and agrees to plan.  Medications - No data to display  Labs (all labs ordered are listed, but only abnormal results are displayed) Labs Reviewed  CBC WITH DIFFERENTIAL/PLATELET - Abnormal; Notable for the following:       Result Value   Hemoglobin 11.7 (*)    HCT 34.7 (*)    All other components within normal limits  BASIC METABOLIC PANEL - Abnormal; Notable for the following:     BUN 29 (*)    All other components within normal limits  SEDIMENTATION RATE  C-REACTIVE PROTEIN  URINALYSIS, ROUTINE W REFLEX MICROSCOPIC    EKG  EKG Interpretation None       Radiology Dg Chest 2 View  Result Date: 07/07/2016 CLINICAL DATA:  Fever.  Chills.  Cough.  Back pain. EXAM: CHEST  2 VIEW COMPARISON:  03/04/2016 chest radiograph. FINDINGS: Bilateral breast prostheses with associated surgical clips. Stable cardiomediastinal silhouette with normal heart size and aortic atherosclerosis. No pneumothorax. No pleural effusion. Lungs appear clear, with no acute consolidative airspace disease and no pulmonary edema. IMPRESSION: No active cardiopulmonary disease. Aortic atherosclerosis. Electronically Signed   By: Ilona Sorrel M.D.   On: 07/07/2016 14:34    Procedures Procedures (including critical care time)  Medications Ordered in ED Medications - No data to display   Initial Impression / Assessment and Plan /  ED Course  I have reviewed the triage vital signs and the nursing notes.  Pertinent labs & imaging results that were available during my care of the patient were reviewed by me and considered in my medical decision making (see chart for details).     I reviewed the available records in [facility] and also Care Everywhere, updated the chart. The patient's oncology care is received through [facility]. In February 2018, she was in pain contract with [facility] but contract was canceled due to cocaine found in her urine. According to controlled substance registry, patient has been receiving short courses of morphine and oxycodone, with a 7 day course that was provided on 06/24/2016. Patient was not initially forthcoming about this, only stating that she was weaned off of her pain medication by her oncologist through [facility] and plans to transfer care to [facility]. She does state that her back pain and neck pain is new and not chronic, which is also questionable as she has had MRI cervical  spine/lumbar spine in 11/2015. She does have concerning history of metastatic melanoma, as well as reports of subjective fevers and chills. Complains of difficulty getting urine out, will obtain PVR. Remainder of neuro exam in tact. Discussed with her that we will be unable to provide her with pain medication prescription and she needs to follow-up with pain doctor.   Plan to perform MRI of the cervical spine and lumbar spine for evaluation of metastatic disease versus infection versus other serious spinal cord process. Prior to this, patient eloped from the emergency department.  I personally performed the services described in this documentation, which was scribed in my presence. The recorded information has been reviewed and is accurate.  Final Clinical Impressions(s) / ED Diagnoses   Final diagnoses:  Back pain    New Prescriptions Discharge Medication List as of 07/07/2016  4:49 PM       Forde Dandy, MD 07/07/16 2149

## 2016-07-07 NOTE — ED Notes (Addendum)
Pt asking for pain medicine. Informed Erline Levine - RN.

## 2016-07-07 NOTE — ED Notes (Signed)
Patient wanted to leave because she was hungry. I told her she needed to wait until the MRI was complete. She agreed to tay when MRI said they were coming to get her soon. Patient left 10 minutes later. She seemed able to walk unimpeded.

## 2016-07-07 NOTE — ED Notes (Signed)
Patient stated that she wanted to leave. MRI called and stated they were coming to get her in 30 minutes. I asked her if she wanted to stay for that and she stated that she would. Patient walked out 10 minutes later. DC her from the system

## 2016-07-07 NOTE — ED Notes (Signed)
Placed Fall Risk (yellow) band on pt's left wrist.

## 2016-07-08 ENCOUNTER — Emergency Department (HOSPITAL_COMMUNITY): Payer: Medicaid Other

## 2016-07-08 ENCOUNTER — Emergency Department (HOSPITAL_COMMUNITY)
Admission: EM | Admit: 2016-07-08 | Discharge: 2016-07-08 | Disposition: A | Discharge status: Home / Self Care | Payer: Medicaid Other | Attending: Emergency Medicine | Admitting: Emergency Medicine

## 2016-07-08 ENCOUNTER — Encounter (HOSPITAL_COMMUNITY): Payer: Self-pay | Admitting: Emergency Medicine

## 2016-07-08 DIAGNOSIS — M542 Cervicalgia: Secondary | ICD-10-CM | POA: Diagnosis not present

## 2016-07-08 DIAGNOSIS — M549 Dorsalgia, unspecified: Secondary | ICD-10-CM

## 2016-07-08 DIAGNOSIS — F17210 Nicotine dependence, cigarettes, uncomplicated: Secondary | ICD-10-CM | POA: Insufficient documentation

## 2016-07-08 DIAGNOSIS — G8929 Other chronic pain: Secondary | ICD-10-CM | POA: Diagnosis not present

## 2016-07-08 DIAGNOSIS — M545 Low back pain, unspecified: Secondary | ICD-10-CM

## 2016-07-08 DIAGNOSIS — Z79899 Other long term (current) drug therapy: Secondary | ICD-10-CM | POA: Insufficient documentation

## 2016-07-08 DIAGNOSIS — E039 Hypothyroidism, unspecified: Secondary | ICD-10-CM | POA: Insufficient documentation

## 2016-07-08 LAB — BASIC METABOLIC PANEL
Anion gap: 9 (ref 5–15)
BUN: 23 mg/dL — AB (ref 6–20)
CHLORIDE: 101 mmol/L (ref 101–111)
CO2: 28 mmol/L (ref 22–32)
CREATININE: 0.68 mg/dL (ref 0.44–1.00)
Calcium: 9.3 mg/dL (ref 8.9–10.3)
GFR calc Af Amer: >60 mL/min (ref >60)
GFR calc non Af Amer: >60 mL/min (ref >60)
GLUCOSE: 86 mg/dL (ref 65–99)
POTASSIUM: 3.7 mmol/L (ref 3.5–5.1)
Sodium: 138 mmol/L (ref 135–145)

## 2016-07-08 LAB — CBC WITH DIFFERENTIAL/PLATELET
Basophils Absolute: 0 10*3/uL (ref 0.0–0.1)
Basophils Relative: 0 %
Eosinophils Absolute: 0.2 10*3/uL (ref 0.0–0.7)
Eosinophils Relative: 4 %
HCT: 34.8 % — ABNORMAL LOW (ref 36.0–46.0)
HEMOGLOBIN: 11.6 g/dL — AB (ref 12.0–15.0)
LYMPHS ABS: 1.2 10*3/uL (ref 0.7–4.0)
LYMPHS PCT: 24 %
MCH: 29.8 pg (ref 26.0–34.0)
MCHC: 33.3 g/dL (ref 30.0–36.0)
MCV: 89.5 fL (ref 78.0–100.0)
Monocytes Absolute: 0.4 10*3/uL (ref 0.1–1.0)
Monocytes Relative: 8 %
Neutro Abs: 3.3 10*3/uL (ref 1.7–7.7)
Neutrophils Relative %: 64 %
Platelets: 216 10*3/uL (ref 150–400)
RBC: 3.89 MIL/uL (ref 3.87–5.11)
RDW: 14.1 % (ref 11.5–15.5)
WBC: 5.2 10*3/uL (ref 4.0–10.5)

## 2016-07-08 MED ORDER — METHOCARBAMOL 500 MG PO TABS
1000.0000 mg | ORAL_TABLET | Freq: Once | ORAL | Status: AC
  Administered 2016-07-08: 1000 mg via ORAL
  Filled 2016-07-08: qty 2

## 2016-07-08 MED ORDER — GADOBENATE DIMEGLUMINE 529 MG/ML IV SOLN
10.0000 mL | Freq: Once | INTRAVENOUS | Status: AC
  Administered 2016-07-08: 10 mL via INTRAVENOUS

## 2016-07-08 MED ORDER — OXYCODONE-ACETAMINOPHEN 5-325 MG PO TABS
1.0000 | ORAL_TABLET | Freq: Once | ORAL | Status: AC
  Administered 2016-07-08: 1 via ORAL
  Filled 2016-07-08: qty 1

## 2016-07-08 MED ORDER — NAPROXEN 250 MG PO TABS
500.0000 mg | ORAL_TABLET | Freq: Once | ORAL | Status: AC
  Administered 2016-07-08: 500 mg via ORAL
  Filled 2016-07-08: qty 2

## 2016-07-08 MED ORDER — OXYCODONE-ACETAMINOPHEN 5-325 MG PO TABS
2.0000 | ORAL_TABLET | Freq: Once | ORAL | Status: AC
  Administered 2016-07-08: 2 via ORAL
  Filled 2016-07-08: qty 2

## 2016-07-08 NOTE — ED Provider Notes (Signed)
[facility] DEPT Provider Note   CSN: [id number] Arrival date & time: 07/08/16  0630     History   Chief Complaint Chief Complaint  Patient presents with  . Back Pain    HPI Yolanda Krueger is a 51 y.o. female who presents with neck and low back pain. PMH significant for chronic back pain, metastatic melanoma (to brain, lung, and possibly neck), She completed radiation in November. Her pain contract was recently terminated due to cocaine use. She was here yesterday for worsening of her neck and low back pain. She left AMA because she got "frustrated" and was here a long time. She is back today because her pain is worsened. She denies fever but states last night she "felt hot". No syncope, trauma, loss of bowel/bladder function, saddle anesthesia, urinary retention, IVDU.    HPI  Past Medical History:  Diagnosis Date  . Bipolar 1 disorder ([place])   . Cancer ([place])   . Hypothyroid   . Malignant melanoma, metastatic ([place])   . PTSD (post-traumatic stress disorder)     Patient Active Problem List   Diagnosis Date Noted  . Abscess of left little finger 03/08/2016  . Acute delirium   . Brain metastases ([place])   . Cerebrovascular disease   . Pressure injury of skin 03/05/2016  . Altered mental status 03/04/2016  . Syncope 03/04/2016  . Metastatic melanoma ([place]) 03/04/2016  . Extensive facial fractures ([place]) 03/04/2016  . Bipolar 1 disorder ([place]) 03/04/2016  . Hypothyroid 03/04/2016  . Acute respiratory failure ([place]) 03/04/2016  . PTSD (post-traumatic stress disorder) 03/04/2016  . Closed fracture of nasal bones   . Confusion     Past Surgical History:  Procedure Laterality Date  . MELANOMA EXCISION      OB History    No data available       Home Medications    Prior to Admission medications   Medication Sig Start Date End Date Taking? Authorizing Provider  clonazePAM (KLONOPIN) 0.5 MG tablet Take 0.5 mg by mouth 2 (two) times daily as needed for anxiety.   Yes  Historical Provider, MD  DULoxetine (CYMBALTA) 30 MG capsule Take 30 mg by mouth 2 (two) times daily.   Yes Historical Provider, MD  levothyroxine (SYNTHROID, LEVOTHROID) 125 MCG tablet Take 125 mcg by mouth daily before breakfast.   Yes Historical Provider, MD  pregabalin (LYRICA) 75 MG capsule Take 75 mg by mouth 3 (three) times daily.   Yes Historical Provider, MD  QUEtiapine (SEROQUEL) 100 MG tablet Take 150 mg by mouth at bedtime.    Yes Historical Provider, MD  naproxen (NAPROSYN) 375 MG tablet Take 375 mg by mouth 2 (two) times daily with a meal.    Historical Provider, MD  ondansetron (ZOFRAN ODT) 8 MG disintegrating tablet 8mg  ODT q4 hours prn nausea 06/12/16   Veryl Speak, MD  ondansetron (ZOFRAN) 8 MG tablet Take by mouth every 8 (eight) hours as needed for nausea or vomiting.    Historical Provider, MD  polyethylene glycol (MIRALAX / GLYCOLAX) packet Take 17 g by mouth 2 (two) times daily as needed for mild constipation.    Historical Provider, MD  prochlorperazine (COMPAZINE) 10 MG tablet Take 10 mg by mouth every 6 (six) hours as needed for nausea or vomiting.    Historical Provider, MD  senna (SENOKOT) 8.6 MG TABS tablet Take 2 tablets by mouth 2 (two) times daily.    Historical Provider, MD    Family History Family History  Problem Relation  Age of Onset  . Family history unknown: Yes    Social History Social History  Substance Use Topics  . Smoking status: Current Every Day Smoker    Packs/day: 1.00    Types: Cigarettes  . Smokeless tobacco: Never Used  . Alcohol use No     Allergies   Penicillins   Review of Systems Review of Systems  Constitutional: Negative for fever.  Musculoskeletal: Positive for back pain, myalgias and neck pain. Negative for gait problem.  Neurological: Negative for weakness and numbness.     Physical Exam Updated Vital Signs BP 97/77 (BP Location: Left Arm)   Pulse 89   Temp 97.4 F (36.3 C) (Oral)   Resp 16   Ht 5\' 4"  (1.626 m)    Wt 51.3 kg   LMP  (LMP Unknown)   SpO2 100%   BMI 19.40 kg/m   Physical Exam  Constitutional: She is oriented to person, place, and time. She appears well-developed and well-nourished. No distress.  HENT:  Head: Normocephalic and atraumatic.  Surgical scar on neck  Eyes: Conjunctivae are normal. Pupils are equal, round, and reactive to light. Right eye exhibits no discharge. Left eye exhibits no discharge. No scleral icterus.  Neck: Normal range of motion.  Cardiovascular: Normal rate.   Pulmonary/Chest: Effort normal. No respiratory distress.  Abdominal: She exhibits no distension.  Musculoskeletal:  Inspection: No masses, deformity, or rash Palpation: Diffuse midline tenderness Strength: 5/5 in lower extremities and normal plantar and dorsiflexion Sensation: Intact sensation with light touch in lower extremities bilaterally Reflexes: Patellar reflex is 1+ bilaterally SLR: Negative seated straight leg raise Gait: Normal gait   Neurological: She is alert and oriented to person, place, and time.  Skin: Skin is warm and dry.  Psychiatric: She has a normal mood and affect. Her behavior is normal.  Nursing note and vitals reviewed.    ED Treatments / Results  Labs (all labs ordered are listed, but only abnormal results are displayed) Labs Reviewed - No data to display  EKG  EKG Interpretation None       Radiology Dg Chest 2 View  Result Date: 07/07/2016 CLINICAL DATA:  Fever.  Chills.  Cough.  Back pain. EXAM: CHEST  2 VIEW COMPARISON:  03/04/2016 chest radiograph. FINDINGS: Bilateral breast prostheses with associated surgical clips. Stable cardiomediastinal silhouette with normal heart size and aortic atherosclerosis. No pneumothorax. No pleural effusion. Lungs appear clear, with no acute consolidative airspace disease and no pulmonary edema. IMPRESSION: No active cardiopulmonary disease. Aortic atherosclerosis. Electronically Signed   By: Ilona Sorrel M.D.   On:  07/07/2016 14:34   Mr Total Spine Mets Screening  Result Date: 07/08/2016 CLINICAL DATA:  Neck and back pain. Fever. Urinary retention. History of melanoma. EXAM: MRI TOTAL SPINE WITHOUT AND WITH CONTRAST TECHNIQUE: Multisequence MR imaging of the spine from the cervical spine to the sacrum was performed prior to and following IV contrast administration for evaluation of spinal metastatic disease. CONTRAST:  35mL MULTIHANCE GADOBENATE DIMEGLUMINE 529 MG/ML IV SOLN COMPARISON:  Cervical spine CT 03/04/2016 FINDINGS: A screening protocol was performed utilizing large field-of-view sagittal imaging of the the total spine. Axial imaging was only performed through the mid and lower cervical spine. Multiple sequences are mildly motion degraded. MRI CERVICAL SPINE FINDINGS Alignment: Cervical spine straightening.  No significant listhesis. Vertebrae: No evidence of fracture or suspicious osseous lesion. Cord: No definite spinal cord signal abnormality within limitations of motion artifact. No abnormal intradural enhancement. Posterior Fossa, vertebral arteries, paraspinal  tissues: Prior left neck dissection. 9 mm short axis right level III lymph node (same size reported on a [facility] neck CT from 04/22/2016). Disc levels: C4-5: Broad-based posterior disc osteophyte complex results in mild-to-moderate spinal stenosis and moderate right and severe left neural foraminal stenosis. C5-6: Broad-based posterior disc osteophyte complex results in moderate spinal stenosis and severe right and moderate to severe left neural foraminal stenosis. C6-7: Broad-based posterior disc osteophyte complex results in mild-to-moderate left neural foraminal stenosis without significant spinal stenosis. MRI THORACIC SPINE FINDINGS Alignment: Slight right convex curvature of the thoracic spine. No listhesis. Vertebrae: No evidence of fracture or suspicious osseous lesion. Cord:  Normal signal and morphology. Paraspinal and other soft tissues: No  significant abnormality identified. Disc levels: Small mid upper thoracic spine Schmorl's nodes. No evidence of significant disc herniation or stenosis on the large field-of-view sagittal images. MRI LUMBAR SPINE FINDINGS Segmentation:  Transitional anatomy with sacralized L5. Alignment:  Normal. Vertebrae: Preserved vertebral body heights. 8 mm T1 hypointense, T2 hyperintense, enhancing focus in the right aspect of the L2 vertebral body. No destructive osseous lesion. Conus medullaris: Extends to the L1 level and appears normal. No abnormal intradural enhancement. Paraspinal and other soft tissues: No significant abnormality identified. Disc levels: Disc desiccation and mild type 2 degenerative endplate marrow changes at L4-5 with disc bulging resulting in mild bilateral neural foraminal narrowing. No evidence of lumbar spinal stenosis. Preserved disc height and hydration elsewhere. IMPRESSION: 1. No definite evidence of spinal metastatic disease. Subcentimeter enhancing focus in the L2 vertebral body is indeterminate and while a small metastasis is not excluded, this may represent an atypical hemangioma. 2. Disc degeneration from C4-5 to C6-7 with up to moderate spinal stenosis and severe neural foraminal stenosis as above. Electronically Signed   By: Logan Bores M.D.   On: 07/08/2016 11:09    Procedures Procedures (including critical care time)  Medications Ordered in ED Medications  oxyCODONE-acetaminophen (PERCOCET/ROXICET) 5-325 MG per tablet 2 tablet (2 tablets Oral Given 07/08/16 0812)  gadobenate dimeglumine (MULTIHANCE) injection 10 mL (10 mLs Intravenous Contrast Given 07/08/16 1041)  oxyCODONE-acetaminophen (PERCOCET/ROXICET) 5-325 MG per tablet 1 tablet (1 tablet Oral Given 07/08/16 1156)  methocarbamol (ROBAXIN) tablet 1,000 mg (1,000 mg Oral Given 07/08/16 1156)  naproxen (NAPROSYN) tablet 500 mg (500 mg Oral Given 07/08/16 1156)     Initial Impression / Assessment and Plan / ED Course  I have  reviewed the triage vital signs and the nursing notes.  Pertinent labs & imaging results that were available during my care of the patient were reviewed by me and considered in my medical decision making (see chart for details).  51 year old female with acute on chronic back pain likely due to pain contract being terminated. Vitals are normal. MRI ordered due to hx of metastatic cancer. MRI of cervical, thoracic, and lumbar spine is is unremarkable. On review of [facility], Morphine and Oxycodone were refilled on 3/20 for one week supply. Pain control given in ED but discussed that we will not give rx for pain medicine. She verbalized understanding. Advised to continue to avoid illegal drugs and to re-establish herself with a pain management doctor/PCP. Return precautions given.  Final Clinical Impressions(s) / ED Diagnoses   Final diagnoses:  Back pain  Chronic bilateral low back pain without sciatica  Chronic neck pain    New Prescriptions New Prescriptions   No medications on file     Recardo Evangelist, PA-C 07/08/16 [place] Yao,  MD 07/09/16 [id number]

## 2016-07-08 NOTE — ED Triage Notes (Addendum)
Pt arrives with back pain ongoing since yesterday, states she was seen yesterday and left. States MD wanted to have an MRI but she left AMA. States she hasn't been taking any PO meds. Reports some nausea. Tingling R foot. Intermittent hand tingling.  Per chart review pt's pain is chronic. States it only started yesterday.

## 2016-07-08 NOTE — Discharge Instructions (Signed)
Please follow up with your family doctor for pain management

## 2016-07-08 NOTE — ED Notes (Signed)
Pt is in stable condition upon d/c and ambulates from ED. Pt given meds, food and drink before leaving.

## 2016-07-08 NOTE — ED Notes (Signed)
Patient transported to MRI 

## 2016-07-08 NOTE — ED Notes (Signed)
Pt is requesting food and a drink. Pt informed once results come back I will ask the doctor if she can have anything. Pt is agreeable.

## 2016-07-08 NOTE — ED Notes (Signed)
Nurse starting IV and drawing labs. 

## 2016-07-08 NOTE — ED Notes (Signed)
Hooked patient back up to the monitor patient is resting

## 2016-07-11 ENCOUNTER — Emergency Department (HOSPITAL_COMMUNITY)
Admission: EM | Admit: 2016-07-11 | Discharge: 2016-07-11 | Disposition: A | Discharge status: Home / Self Care | Payer: Medicaid Other | Attending: Emergency Medicine | Admitting: Emergency Medicine

## 2016-07-11 ENCOUNTER — Encounter (HOSPITAL_COMMUNITY): Payer: Self-pay | Admitting: Emergency Medicine

## 2016-07-11 ENCOUNTER — Emergency Department (HOSPITAL_COMMUNITY)
Admission: EM | Admit: 2016-07-11 | Discharge: 2016-07-11 | Disposition: A | Discharge status: Home / Self Care | Payer: Medicaid Other | Source: Home / Self Care | Attending: Emergency Medicine | Admitting: Emergency Medicine

## 2016-07-11 DIAGNOSIS — E039 Hypothyroidism, unspecified: Secondary | ICD-10-CM | POA: Insufficient documentation

## 2016-07-11 DIAGNOSIS — Z79899 Other long term (current) drug therapy: Secondary | ICD-10-CM | POA: Diagnosis not present

## 2016-07-11 DIAGNOSIS — Z85841 Personal history of malignant neoplasm of brain: Secondary | ICD-10-CM | POA: Insufficient documentation

## 2016-07-11 DIAGNOSIS — F17210 Nicotine dependence, cigarettes, uncomplicated: Secondary | ICD-10-CM | POA: Insufficient documentation

## 2016-07-11 DIAGNOSIS — R112 Nausea with vomiting, unspecified: Secondary | ICD-10-CM | POA: Insufficient documentation

## 2016-07-11 LAB — COMPREHENSIVE METABOLIC PANEL
ALK PHOS: 103 U/L (ref 38–126)
ALT: 18 U/L (ref 14–54)
AST: 30 U/L (ref 15–41)
Albumin: 4.3 g/dL (ref 3.5–5.0)
Anion gap: 11 (ref 5–15)
BUN: 18 mg/dL (ref 6–20)
CALCIUM: 9.6 mg/dL (ref 8.9–10.3)
CO2: 29 mmol/L (ref 22–32)
Chloride: 102 mmol/L (ref 101–111)
Creatinine, Ser: 0.69 mg/dL (ref 0.44–1.00)
Glucose, Bld: 159 mg/dL — ABNORMAL HIGH (ref 65–99)
Potassium: 3.7 mmol/L (ref 3.5–5.1)
SODIUM: 142 mmol/L (ref 135–145)
Total Bilirubin: 0.1 mg/dL — ABNORMAL LOW (ref 0.3–1.2)
Total Protein: 6.9 g/dL (ref 6.5–8.1)

## 2016-07-11 LAB — PREGNANCY, URINE: PREG TEST UR: NEGATIVE

## 2016-07-11 LAB — URINALYSIS, ROUTINE W REFLEX MICROSCOPIC
Bilirubin Urine: NEGATIVE
Glucose, UA: NEGATIVE mg/dL
HGB URINE DIPSTICK: NEGATIVE
Ketones, ur: NEGATIVE mg/dL
LEUKOCYTES UA: NEGATIVE
NITRITE: NEGATIVE
PROTEIN: NEGATIVE mg/dL
SPECIFIC GRAVITY, URINE: 1.016 (ref 1.005–1.030)
pH: 7 (ref 5.0–8.0)

## 2016-07-11 LAB — CBC
HCT: 36.1 % (ref 36.0–46.0)
HEMOGLOBIN: 11.9 g/dL — AB (ref 12.0–15.0)
MCH: 29.8 pg (ref 26.0–34.0)
MCHC: 33 g/dL (ref 30.0–36.0)
MCV: 90.5 fL (ref 78.0–100.0)
Platelets: 223 10*3/uL (ref 150–400)
RBC: 3.99 MIL/uL (ref 3.87–5.11)
RDW: 14 % (ref 11.5–15.5)
WBC: 8.1 10*3/uL (ref 4.0–10.5)

## 2016-07-11 LAB — LIPASE, BLOOD: LIPASE: 12 U/L (ref 11–51)

## 2016-07-11 MED ORDER — ONDANSETRON 4 MG PO TBDP
4.0000 mg | ORAL_TABLET | Freq: Once | ORAL | Status: AC | PRN
  Administered 2016-07-11: 4 mg via ORAL

## 2016-07-11 MED ORDER — ONDANSETRON 4 MG PO TBDP
4.0000 mg | ORAL_TABLET | Freq: Once | ORAL | Status: AC
  Administered 2016-07-11: 4 mg via ORAL
  Filled 2016-07-11: qty 1

## 2016-07-11 MED ORDER — ONDANSETRON 4 MG PO TBDP
ORAL_TABLET | ORAL | Status: AC
  Filled 2016-07-11: qty 1

## 2016-07-11 NOTE — ED Provider Notes (Signed)
[facility] DEPT Provider Note   CSN: [id number] Arrival date & time: 07/11/16  1035     History   Chief Complaint Chief Complaint  Patient presents with  . Emesis    HPI Yolanda Krueger is a 51 y.o. female.  The history is provided by the patient.  Emesis   This is a new problem. The current episode started yesterday. The problem occurs continuously. The problem has not changed since onset.The emesis has an appearance of stomach contents. There has been no fever. Pertinent negatives include no abdominal pain, no chills, no cough and no diarrhea.    Past Medical History:  Diagnosis Date  . Bipolar 1 disorder ([place])   . Cancer ([place])   . Hypothyroid   . Malignant melanoma, metastatic ([place])   . PTSD (post-traumatic stress disorder)     Patient Active Problem List   Diagnosis Date Noted  . Abscess of left little finger 03/08/2016  . Acute delirium   . Brain metastases ([place])   . Cerebrovascular disease   . Pressure injury of skin 03/05/2016  . Altered mental status 03/04/2016  . Syncope 03/04/2016  . Metastatic melanoma ([place]) 03/04/2016  . Extensive facial fractures ([place]) 03/04/2016  . Bipolar 1 disorder ([place]) 03/04/2016  . Hypothyroid 03/04/2016  . Acute respiratory failure ([place]) 03/04/2016  . PTSD (post-traumatic stress disorder) 03/04/2016  . Closed fracture of nasal bones   . Confusion     Past Surgical History:  Procedure Laterality Date  . MELANOMA EXCISION      OB History    No data available       Home Medications    Prior to Admission medications   Medication Sig Start Date End Date Taking? Authorizing Provider  clonazePAM (KLONOPIN) 0.5 MG tablet Take 0.5 mg by mouth 2 (two) times daily as needed for anxiety.    Historical Provider, MD  dronabinol (MARINOL) 5 MG capsule Take 5 mg by mouth 2 (two) times daily as needed (for appetite).    Historical Provider, MD  DULoxetine (CYMBALTA) 30 MG capsule Take 30 mg by mouth 2 (two) times daily.     Historical Provider, MD  levothyroxine (SYNTHROID, LEVOTHROID) 125 MCG tablet Take 125 mcg by mouth daily before breakfast.    Historical Provider, MD  naproxen (NAPROSYN) 375 MG tablet Take 375 mg by mouth 2 (two) times daily as needed for mild pain.     Historical Provider, MD  nicotine (NICODERM CQ - DOSED IN MG/24 HOURS) 21 mg/24hr patch Place 21 mg onto the skin daily as needed (smoking cessation).    Historical Provider, MD  nicotine polacrilex (NICORETTE) 2 MG gum Take 2 mg by mouth every 4 (four) hours as needed for smoking cessation.    Historical Provider, MD  ondansetron (ZOFRAN ODT) 8 MG disintegrating tablet 8mg  ODT q4 hours prn nausea 06/12/16   Veryl Speak, MD  ondansetron (ZOFRAN) 8 MG tablet Take by mouth every 8 (eight) hours as needed for nausea or vomiting.    Historical Provider, MD  polyethylene glycol (MIRALAX / GLYCOLAX) packet Take 17 g by mouth 2 (two) times daily as needed for mild constipation.    Historical Provider, MD  pregabalin (LYRICA) 75 MG capsule Take 75 mg by mouth 3 (three) times daily.    Historical Provider, MD  prochlorperazine (COMPAZINE) 10 MG tablet Take 10 mg by mouth every 6 (six) hours as needed for nausea or vomiting.    Historical Provider, MD  QUEtiapine (SEROQUEL) 100 MG tablet Take  150 mg by mouth at bedtime.     Historical Provider, MD  senna (SENOKOT) 8.6 MG TABS tablet Take 2 tablets by mouth 2 (two) times daily as needed for mild constipation.     Historical Provider, MD  traZODone (DESYREL) 50 MG tablet Take 50 mg by mouth at bedtime.    Historical Provider, MD    Family History Family History  Problem Relation Age of Onset  . Family history unknown: Yes    Social History Social History  Substance Use Topics  . Smoking status: Current Every Day Smoker    Packs/day: 1.00    Types: Cigarettes  . Smokeless tobacco: Never Used  . Alcohol use No     Allergies   Penicillins   Review of Systems Review of Systems  Constitutional:  Negative for chills.  Respiratory: Negative for cough.   Gastrointestinal: Positive for vomiting. Negative for abdominal pain and diarrhea.  All other systems reviewed and are negative.    Physical Exam Updated Vital Signs BP (!) 164/97 (BP Location: Left Arm)   Pulse 92   Temp 97.7 F (36.5 C) (Oral)   Resp 19   Ht 5\' 4"  (1.626 m)   Wt 115 lb (52.2 kg)   LMP  (LMP Unknown)   SpO2 100%   BMI 19.74 kg/m   Physical Exam  Constitutional: She is oriented to person, place, and time. She appears well-developed and well-nourished. No distress.  HENT:  Head: Normocephalic.  Nose: Nose normal.  Eyes: Conjunctivae are normal.  Neck: Neck supple. No tracheal deviation present.  Cardiovascular: Normal rate, regular rhythm and normal heart sounds.   Pulmonary/Chest: Effort normal and breath sounds normal. No respiratory distress.  Abdominal: Soft. She exhibits no distension. There is no tenderness. There is no guarding.  Neurological: She is alert and oriented to person, place, and time.  Skin: Skin is warm and dry.  Psychiatric: She has a normal mood and affect.  Vitals reviewed.    ED Treatments / Results  Labs (all labs ordered are listed, but only abnormal results are displayed) Labs Reviewed  PREGNANCY, URINE    EKG  EKG Interpretation None       Radiology No results found.  Procedures Procedures (including critical care time)  Medications Ordered in ED Medications  ondansetron (ZOFRAN-ODT) disintegrating tablet 4 mg (4 mg Oral Given 07/11/16 1404)     Initial Impression / Assessment and Plan / ED Course  I have reviewed the triage vital signs and the nursing notes.  Pertinent labs & imaging results that were available during my care of the patient were reviewed by me and considered in my medical decision making (see chart for details).     51 y.o. female presents with vomiting since last night. She is requesting pain medication for chronic back pain as  she lost her pain contract. She is tolerating PO and not vomiting here. Was provided dose of zofran to help with symptoms. She had [facility] and left prior to evaluation because of wait time. Those are reassuring for lack of emergent medical condition combined with her current clinical appearance. Plan to follow up with PCP as needed and return precautions discussed for worsening or new concerning symptoms.   Final Clinical Impressions(s) / ED Diagnoses   Final diagnoses:  Non-intractable vomiting with nausea, unspecified vomiting type    New Prescriptions New Prescriptions   No medications on file     Leo Grosser, MD 07/11/16 1858

## 2016-07-11 NOTE — ED Triage Notes (Addendum)
Patient reports emesis starting this morning at 1am. Denies diarrhea/abdominal pain. Hx of melanoma and brain tumor. Not currently on chemotherapy. Patient checked into [facility] this morning (had blood/urine tested). Patient LWBS due to wait time.

## 2016-07-11 NOTE — ED Notes (Signed)
Unable to locate in waiting room. LWBS.

## 2016-07-11 NOTE — ED Triage Notes (Signed)
Patient woke up from sleep about an hour and a half ago with nausea and vomiting.  She does have some lower back pain with the nausea and vomiting.

## 2016-07-11 NOTE — ED Notes (Signed)
Bed: WA19 Expected date:  Expected time:  Means of arrival:  Comments: EMS/abd. pain 

## 2016-07-11 NOTE — ED Notes (Signed)
Bed: WLPT1 Expected date:  Expected time:  Means of arrival:  Comments: 

## 2016-07-16 ENCOUNTER — Emergency Department (HOSPITAL_COMMUNITY)
Admission: EM | Admit: 2016-07-16 | Discharge: 2016-07-16 | Disposition: A | Discharge status: Home / Self Care | Payer: Medicaid Other | Attending: Emergency Medicine | Admitting: Emergency Medicine

## 2016-07-16 ENCOUNTER — Encounter (HOSPITAL_COMMUNITY): Payer: Self-pay

## 2016-07-16 DIAGNOSIS — R112 Nausea with vomiting, unspecified: Secondary | ICD-10-CM

## 2016-07-16 DIAGNOSIS — E860 Dehydration: Secondary | ICD-10-CM | POA: Diagnosis not present

## 2016-07-16 DIAGNOSIS — E039 Hypothyroidism, unspecified: Secondary | ICD-10-CM | POA: Insufficient documentation

## 2016-07-16 DIAGNOSIS — F17210 Nicotine dependence, cigarettes, uncomplicated: Secondary | ICD-10-CM | POA: Diagnosis not present

## 2016-07-16 DIAGNOSIS — Z79899 Other long term (current) drug therapy: Secondary | ICD-10-CM | POA: Diagnosis not present

## 2016-07-16 DIAGNOSIS — Z859 Personal history of malignant neoplasm, unspecified: Secondary | ICD-10-CM | POA: Diagnosis not present

## 2016-07-16 DIAGNOSIS — E876 Hypokalemia: Secondary | ICD-10-CM | POA: Diagnosis not present

## 2016-07-16 DIAGNOSIS — G8929 Other chronic pain: Secondary | ICD-10-CM

## 2016-07-16 DIAGNOSIS — R1110 Vomiting, unspecified: Secondary | ICD-10-CM | POA: Diagnosis present

## 2016-07-16 LAB — COMPREHENSIVE METABOLIC PANEL
ALT: 15 U/L (ref 14–54)
ANION GAP: 17 — AB (ref 5–15)
AST: 25 U/L (ref 15–41)
Albumin: 4.8 g/dL (ref 3.5–5.0)
Alkaline Phosphatase: 120 U/L (ref 38–126)
BUN: 13 mg/dL (ref 6–20)
CHLORIDE: 79 mmol/L — AB (ref 101–111)
CO2: 37 mmol/L — ABNORMAL HIGH (ref 22–32)
Calcium: 10 mg/dL (ref 8.9–10.3)
Creatinine, Ser: 0.76 mg/dL (ref 0.44–1.00)
Glucose, Bld: 113 mg/dL — ABNORMAL HIGH (ref 65–99)
POTASSIUM: 2.7 mmol/L — AB (ref 3.5–5.1)
Sodium: 133 mmol/L — ABNORMAL LOW (ref 135–145)
Total Bilirubin: 0.7 mg/dL (ref 0.3–1.2)
Total Protein: 8.1 g/dL (ref 6.5–8.1)

## 2016-07-16 LAB — URINALYSIS, ROUTINE W REFLEX MICROSCOPIC
BILIRUBIN URINE: NEGATIVE
Glucose, UA: NEGATIVE mg/dL
HGB URINE DIPSTICK: NEGATIVE
KETONES UR: 5 mg/dL — AB
LEUKOCYTES UA: NEGATIVE
NITRITE: NEGATIVE
Protein, ur: NEGATIVE mg/dL
Specific Gravity, Urine: 1.013 (ref 1.005–1.030)
pH: 7 (ref 5.0–8.0)

## 2016-07-16 LAB — CBC
HEMATOCRIT: 42.3 % (ref 36.0–46.0)
Hemoglobin: 14.9 g/dL (ref 12.0–15.0)
MCH: 30.6 pg (ref 26.0–34.0)
MCHC: 35.2 g/dL (ref 30.0–36.0)
MCV: 86.9 fL (ref 78.0–100.0)
Platelets: 210 10*3/uL (ref 150–400)
RBC: 4.87 MIL/uL (ref 3.87–5.11)
RDW: 13.4 % (ref 11.5–15.5)
WBC: 7.3 10*3/uL (ref 4.0–10.5)

## 2016-07-16 LAB — I-STAT BETA HCG BLOOD, ED (MC, WL, AP ONLY): I-stat hCG, quantitative: <5 m[IU]/mL (ref <5)

## 2016-07-16 LAB — LIPASE, BLOOD: Lipase: <10 U/L — ABNORMAL LOW (ref 11–51)

## 2016-07-16 MED ORDER — PANTOPRAZOLE SODIUM 20 MG PO TBEC: 20.0000 mg | DELAYED_RELEASE_TABLET | Freq: Every day | ORAL | 0 refills | Status: DC

## 2016-07-16 MED ORDER — POTASSIUM CHLORIDE CRYS ER 20 MEQ PO TBCR
40.0000 meq | EXTENDED_RELEASE_TABLET | Freq: Once | ORAL | Status: AC
  Administered 2016-07-16: 40 meq via ORAL

## 2016-07-16 MED ORDER — SODIUM CHLORIDE 0.9 % IV SOLN
1000.0000 mL | INTRAVENOUS | Status: DC
  Administered 2016-07-16: 1000 mL via INTRAVENOUS

## 2016-07-16 MED ORDER — PANTOPRAZOLE SODIUM 40 MG IV SOLR
40.0000 mg | Freq: Once | INTRAVENOUS | Status: AC
  Administered 2016-07-16: 40 mg via INTRAVENOUS
  Filled 2016-07-16: qty 40

## 2016-07-16 MED ORDER — PROMETHAZINE HCL 25 MG PO TABS: 25.0000 mg | ORAL_TABLET | Freq: Four times a day (QID) | ORAL | 0 refills | Status: DC | PRN

## 2016-07-16 MED ORDER — SODIUM CHLORIDE 0.9 % IV SOLN
30.0000 meq | Freq: Once | INTRAVENOUS | Status: AC
  Administered 2016-07-16: 30 meq via INTRAVENOUS
  Filled 2016-07-16: qty 15

## 2016-07-16 MED ORDER — PROMETHAZINE HCL 25 MG/ML IJ SOLN
25.0000 mg | Freq: Once | INTRAMUSCULAR | Status: AC
  Administered 2016-07-16: 25 mg via INTRAVENOUS
  Filled 2016-07-16: qty 1

## 2016-07-16 MED ORDER — POTASSIUM CHLORIDE CRYS ER 20 MEQ PO TBCR: 20.0000 meq | EXTENDED_RELEASE_TABLET | Freq: Two times a day (BID) | ORAL | 0 refills | Status: DC

## 2016-07-16 MED ORDER — SODIUM CHLORIDE 0.9 % IV SOLN
1000.0000 mL | Freq: Once | INTRAVENOUS | Status: AC
  Administered 2016-07-16: 1000 mL via INTRAVENOUS

## 2016-07-16 MED ORDER — ONDANSETRON 4 MG PO TBDP: 4.0000 mg | ORAL_TABLET | ORAL | 0 refills | Status: DC | PRN

## 2016-07-16 NOTE — ED Triage Notes (Signed)
Pt arrived via EMS and sent to triage. Pt reports emesis x 2 weeks, seen at [facility] on April 6th and was not given a diagnosis.

## 2016-07-16 NOTE — ED Notes (Signed)
Placed patient on the bedpan patient is resting 

## 2016-07-16 NOTE — Discharge Instructions (Signed)
You must schedule a appointment with a family doctor and treatment for pain medication dependency. Use the referral number and your discharge instructions and the resource guide to find providers. Make calls today.

## 2016-07-16 NOTE — ED Provider Notes (Signed)
[facility] DEPT Provider Note   CSN: [id number] Arrival date & time: 07/16/16  0703     History   Chief Complaint Chief Complaint  Patient presents with  . Emesis    HPI Yolanda Krueger is a 51 y.o. female.  HPI Patient states vomiting for 6-7 days. She vomited several times daily and if she tries to eat or drink vomits. She denies diarrhea. She reports subjective fever. Patient's first reported symptom was neck and back pain. She reports also lower abdominal pain. She reports hot cold episodes. She denies pain or burning with urination but reports she is urinating less because she thinks she is not getting any fluids in. Past Medical History:  Diagnosis Date  . Bipolar 1 disorder ([place])   . Cancer ([place])   . Hypothyroid   . Malignant melanoma, metastatic ([place])   . PTSD (post-traumatic stress disorder)     Patient Active Problem List   Diagnosis Date Noted  . Abscess of left little finger 03/08/2016  . Acute delirium   . Brain metastases ([place])   . Cerebrovascular disease   . Pressure injury of skin 03/05/2016  . Altered mental status 03/04/2016  . Syncope 03/04/2016  . Metastatic melanoma ([place]) 03/04/2016  . Extensive facial fractures ([place]) 03/04/2016  . Bipolar 1 disorder ([place]) 03/04/2016  . Hypothyroid 03/04/2016  . Acute respiratory failure ([place]) 03/04/2016  . PTSD (post-traumatic stress disorder) 03/04/2016  . Closed fracture of nasal bones   . Confusion     Past Surgical History:  Procedure Laterality Date  . MELANOMA EXCISION      OB History    No data available       Home Medications    Prior to Admission medications   Medication Sig Start Date End Date Taking? Authorizing Provider  clonazePAM (KLONOPIN) 0.5 MG tablet Take 0.5 mg by mouth 2 (two) times daily as needed for anxiety.    Historical Provider, MD  dronabinol (MARINOL) 5 MG capsule Take 5 mg by mouth 2 (two) times daily as needed (for appetite).    Historical Provider, MD  DULoxetine  (CYMBALTA) 30 MG capsule Take 30 mg by mouth 2 (two) times daily.    Historical Provider, MD  levothyroxine (SYNTHROID, LEVOTHROID) 125 MCG tablet Take 125 mcg by mouth daily before breakfast.    Historical Provider, MD  naproxen (NAPROSYN) 375 MG tablet Take 375 mg by mouth 2 (two) times daily as needed for mild pain.     Historical Provider, MD  nicotine (NICODERM CQ - DOSED IN MG/24 HOURS) 21 mg/24hr patch Place 21 mg onto the skin daily as needed (smoking cessation).    Historical Provider, MD  nicotine polacrilex (NICORETTE) 2 MG gum Take 2 mg by mouth every 4 (four) hours as needed for smoking cessation.    Historical Provider, MD  ondansetron (ZOFRAN ODT) 4 MG disintegrating tablet Take 1 tablet (4 mg total) by mouth every 4 (four) hours as needed for nausea or vomiting. 07/16/16   Charlesetta Shanks, MD  ondansetron (ZOFRAN ODT) 8 MG disintegrating tablet 8mg  ODT q4 hours prn nausea 06/12/16   Veryl Speak, MD  ondansetron (ZOFRAN) 8 MG tablet Take by mouth every 8 (eight) hours as needed for nausea or vomiting.    Historical Provider, MD  pantoprazole (PROTONIX) 20 MG tablet Take 1 tablet (20 mg total) by mouth daily. 07/16/16   Charlesetta Shanks, MD  polyethylene glycol (MIRALAX / GLYCOLAX) packet Take 17 g by mouth 2 (two) times daily as needed  for mild constipation.    Historical Provider, MD  potassium chloride SA (K-DUR,KLOR-CON) 20 MEQ tablet Take 1 tablet (20 mEq total) by mouth 2 (two) times daily. 07/16/16   Charlesetta Shanks, MD  pregabalin (LYRICA) 75 MG capsule Take 75 mg by mouth 3 (three) times daily.    Historical Provider, MD  prochlorperazine (COMPAZINE) 10 MG tablet Take 10 mg by mouth every 6 (six) hours as needed for nausea or vomiting.    Historical Provider, MD  promethazine (PHENERGAN) 25 MG tablet Take 1 tablet (25 mg total) by mouth every 6 (six) hours as needed for nausea or vomiting. 07/16/16   Charlesetta Shanks, MD  QUEtiapine (SEROQUEL) 100 MG tablet Take 150 mg by mouth at bedtime.      Historical Provider, MD  senna (SENOKOT) 8.6 MG TABS tablet Take 2 tablets by mouth 2 (two) times daily as needed for mild constipation.     Historical Provider, MD  traZODone (DESYREL) 50 MG tablet Take 50 mg by mouth at bedtime.    Historical Provider, MD    Family History Family History  Problem Relation Age of Onset  . Family history unknown: Yes    Social History Social History  Substance Use Topics  . Smoking status: Current Every Day Smoker    Packs/day: 1.00    Types: Cigarettes  . Smokeless tobacco: Never Used  . Alcohol use No     Allergies   Penicillins   Review of Systems Review of Systems 10 Systems reviewed and are negative for acute change except as noted in the HPI.  Physical Exam Updated Vital Signs BP (!) 173/101   Pulse 96   Temp 98.3 F (36.8 C) (Oral)   Resp 18   LMP  (LMP Unknown)   SpO2 99%   Physical Exam  Constitutional: She is oriented to person, place, and time. She appears well-developed and well-nourished.  Patient is thin but not cachectic. She is alert and nontoxic no respiratory distress.  HENT:  Mouth/Throat: Oropharynx is clear and moist.  Eyes: Conjunctivae and EOM are normal. Pupils are equal, round, and reactive to light.  Cardiovascular: Normal rate, regular rhythm, normal heart sounds and intact distal pulses.   Pulmonary/Chest: Effort normal and breath sounds normal.  Abdominal: Soft. Bowel sounds are normal. She exhibits no distension and no mass. There is no tenderness. There is no guarding.  Musculoskeletal: Normal range of motion. She exhibits no edema or tenderness.  Neurological: She is alert and oriented to person, place, and time. No cranial nerve deficit. She exhibits normal muscle tone. Coordination normal.  Skin: Skin is warm and dry.  Psychiatric: She has a normal mood and affect.     ED Treatments / Results  Labs (all labs ordered are listed, but only abnormal results are displayed) Labs Reviewed    LIPASE, BLOOD - Abnormal; Notable for the following:       Result Value   Lipase <10 (*)    All other components within normal limits  COMPREHENSIVE METABOLIC PANEL - Abnormal; Notable for the following:    Sodium 133 (*)    Potassium 2.7 (*)    Chloride 79 (*)    CO2 37 (*)    Glucose, Bld 113 (*)    Anion gap 17 (*)    All other components within normal limits  URINALYSIS, ROUTINE W REFLEX MICROSCOPIC - Abnormal; Notable for the following:    Ketones, ur 5 (*)    All other components within normal limits  CBC  I-STAT BETA HCG BLOOD, ED ([facility], [facility], [facility] ONLY)    EKG  EKG Interpretation  Date/Time:  Wednesday July 16 2016 11:01:20 EDT Ventricular Rate:  94 PR Interval:    QRS Duration: 97 QT Interval:  397 QTC Calculation: 497 R Axis:   85 Text Interpretation:  Sinus rhythm Biatrial enlargement Borderline T abnormalities, anterior leads Borderline prolonged QT interval no ischemic changes. t wave dynamic on old ekgs. Confirmed by Johnney Killian, MD, Jeannie Done [id number]) on 07/16/2016 11:07:13 AM       Radiology No results found.  Procedures Procedures (including critical care time)  Medications Ordered in ED Medications  0.9 %  sodium chloride infusion (0 mLs Intravenous Stopped 07/16/16 1120)    Followed by  0.9 %  sodium chloride infusion (0 mLs Intravenous Stopped 07/16/16 1114)    Followed by  0.9 %  sodium chloride infusion (not administered)  potassium chloride 30 mEq in sodium chloride 0.9 % 265 mL (KCL MULTIRUN) IVPB (30 mEq Intravenous Given 07/16/16 1024)  potassium chloride SA (K-DUR,KLOR-CON) CR tablet 40 mEq (not administered)  promethazine (PHENERGAN) injection 25 mg (25 mg Intravenous Given 07/16/16 1021)  pantoprazole (PROTONIX) injection 40 mg (40 mg Intravenous Given 07/16/16 1020)     Initial Impression / Assessment and Plan / ED Course  I have reviewed the triage vital signs and the nursing notes.  Pertinent labs & imaging results that were available during my  care of the patient were reviewed by me and considered in my medical decision making (see chart for details).     Recheck 12:50 patient is alert and nontoxic. She is speaking on the phone and in no distress. There has been no further vomiting in the emergency department. Patient is counseled on her follow-up plan and treatment plan.  Final Clinical Impressions(s) / ED Diagnoses   Final diagnoses:  Non-intractable vomiting with nausea, unspecified vomiting type  Dehydration  Hypokalemia  Other chronic pain  Patient reports chronic vomiting. She has had problems with narcotic withdrawal due to being discontinued from pain clinic. At this time I find no indication of a surgical or infectious etiology for the patient's symptoms. She is clinically well in appearance. Potassium and fluids replaced. Patient is counseled on the necessary follow-up plan.  New Prescriptions New Prescriptions   ONDANSETRON (ZOFRAN ODT) 4 MG DISINTEGRATING TABLET    Take 1 tablet (4 mg total) by mouth every 4 (four) hours as needed for nausea or vomiting.   PANTOPRAZOLE (PROTONIX) 20 MG TABLET    Take 1 tablet (20 mg total) by mouth daily.   POTASSIUM CHLORIDE SA (K-DUR,KLOR-CON) 20 MEQ TABLET    Take 1 tablet (20 mEq total) by mouth 2 (two) times daily.   PROMETHAZINE (PHENERGAN) 25 MG TABLET    Take 1 tablet (25 mg total) by mouth every 6 (six) hours as needed for nausea or vomiting.     Charlesetta Shanks, MD 07/16/16 1300

## 2016-10-27 ENCOUNTER — Encounter: Payer: Self-pay | Admitting: Family Medicine

## 2016-10-27 ENCOUNTER — Ambulatory Visit (INDEPENDENT_AMBULATORY_CARE_PROVIDER_SITE_OTHER): Payer: Medicaid Other | Admitting: Family Medicine

## 2016-10-27 VITALS — BP 120/84 | HR 99 | Temp 98.4°F | Ht 64.0 in | Wt 141.0 lb

## 2016-10-27 DIAGNOSIS — C7931 Secondary malignant neoplasm of brain: Secondary | ICD-10-CM

## 2016-10-27 DIAGNOSIS — Z7689 Persons encountering health services in other specified circumstances: Secondary | ICD-10-CM

## 2016-10-27 DIAGNOSIS — E039 Hypothyroidism, unspecified: Secondary | ICD-10-CM | POA: Diagnosis not present

## 2016-10-27 MED ORDER — DULOXETINE HCL 30 MG PO CPEP: 30.0000 mg | ORAL_CAPSULE | Freq: Two times a day (BID) | ORAL | 2 refills | Status: DC

## 2016-10-27 MED ORDER — TRAZODONE HCL 50 MG PO TABS: 50.0000 mg | ORAL_TABLET | Freq: Every day | ORAL | 2 refills | Status: DC

## 2016-10-27 NOTE — Patient Instructions (Addendum)
It was great seeing you today! We have addressed the following issues today  1. I schedule an MRI of the brain for you, we will discuss the results after your scan  2. I schedule blood work for you CBC, BMP, fasting lipid panel 3. I refilled your trazadone and cymbalta. 4. I will see you in about a month to discuss the results from your test  If we did any lab work today, and the results require attention, either me or my nurse will get in touch with you. If everything is normal, you will get a letter in mail and a message via . If you don't hear from [place] in two weeks, please give [place] a call. Otherwise, we look forward to seeing you again at your next visit. If you have any questions or concerns before then, please call the clinic at [phone].  Please bring all your medications to every doctors visit  Sign up for My Chart to have easy access to your labs results, and communication with your Primary care physician. Please ask Front Desk for some assistance.   Please check-out at the front desk before leaving the clinic.    Take Care,   Dr. Andy Gauss

## 2016-10-27 NOTE — Progress Notes (Signed)
   Subjective:    Patient ID: Yolanda Krueger, female    DOB: Aug 18, 1965, 51 y.o.   MRN: [id number]   CC:  New encounter with PCP  HPI: Patient is a 51 yo female with a past medical history significant for malignant Metastatic Melanoma to Head and Neck, hypothyroidism, bipolar disorder, ductal carcinoma s/p bilateral mastectomies depression who presents today to establish care with primary care provider. Patient reports that she was recently hospitalized after falling and fracturing multiple bones in her face following a seizure. Patient has been doing fairly well since her discharge in November. Patient reports that she is followed by [facility] for her melanoma with metastasis to the brain. Patient has a visit scheduled for 8/21 for follow up brain MRI. Patient reports some pressure behind her left eye and lower eye lid swelling. Patient denies any CP, shortness of breath, abdominal pain, vision changes and dizziness.  Smoking status reviewed   ROS: all other systems were reviewed and are negative other than in the HPI   Past Medical History:  Diagnosis Date  . Bipolar 1 disorder ([place])   . Cancer ([place])   . Hypothyroid   . Malignant melanoma, metastatic ([place])   . PTSD (post-traumatic stress disorder)    Past Surgical History:  Procedure Laterality Date  . MELANOMA EXCISION      Objective:  Ht 5\' 4"  (1.626 m)   Wt 141 lb (64 kg)   LMP  (LMP Unknown)   BMI 24.20 kg/m   Vitals and nursing note reviewed  General: NAD, pleasant, able to participate in exam Cardiac: RRR, normal heart sounds, no murmurs. 2+ radial and PT pulses bilaterally HEENT: Mild left lower eye lid swelling noted on exam Respiratory: CTAB, normal effort, No wheezes, rales or rhonchi Abdomen: soft, nontender, nondistended, no hepatic or splenomegaly, +BS Extremities: no edema or cyanosis. WWP. Skin: warm and dry, no rashes noted Neuro: alert and oriented x4, cranial nerves II-XII intact, focal  deficits Psych: Normal affect and mood   Assessment & Plan:   #Establish care with PCP Patient presents with extensive cancer history followed by [facility]. Patient present today with left side pressure behind her eye for the past few weeks. Last MRI was concerning for edema in the right frontal white matter highly suspicious for metastasis with recommendations for contrasted MRI for further evaluation. Given patient history, current symptoms and exam findings patient would benefit from additional imaging with concern for recurrent or worsening mets. Patient will follow up in clinic after her [facility] appointment. --Order Brain MRI w & w/o contrast --Will order CBC, CMP, fasting lipid panel and TSH --Follow up in clinic to discuss results  #Medications refill --Trazadone 50 mg daily and Cymbalta 30 mg bid were reordered   Marjie Skiff, MD [place] PGY-2

## 2016-10-28 ENCOUNTER — Other Ambulatory Visit: Payer: Medicaid Other

## 2016-10-28 DIAGNOSIS — C7931 Secondary malignant neoplasm of brain: Secondary | ICD-10-CM

## 2016-10-28 DIAGNOSIS — E039 Hypothyroidism, unspecified: Secondary | ICD-10-CM

## 2016-10-29 LAB — CBC WITH DIFFERENTIAL/PLATELET
BASOS ABS: 0 10*3/uL (ref 0.0–0.2)
Basos: 0 %
EOS (ABSOLUTE): 0.2 10*3/uL (ref 0.0–0.4)
Eos: 5 %
HEMOGLOBIN: 12.2 g/dL (ref 11.1–15.9)
Hematocrit: 37.6 % (ref 34.0–46.6)
IMMATURE GRANS (ABS): 0 10*3/uL (ref 0.0–0.1)
Immature Granulocytes: 0 %
LYMPHS ABS: 1.4 10*3/uL (ref 0.7–3.1)
LYMPHS: 29 %
MCH: 30.2 pg (ref 26.6–33.0)
MCHC: 32.4 g/dL (ref 31.5–35.7)
MCV: 93 fL (ref 79–97)
MONOCYTES: 8 %
Monocytes Absolute: 0.4 10*3/uL (ref 0.1–0.9)
NEUTROS PCT: 58 %
Neutrophils Absolute: 2.9 10*3/uL (ref 1.4–7.0)
Platelets: 216 10*3/uL (ref 150–379)
RBC: 4.04 x10E6/uL (ref 3.77–5.28)
RDW: 13.4 % (ref 12.3–15.4)
WBC: 4.9 10*3/uL (ref 3.4–10.8)

## 2016-10-29 LAB — CMP14+EGFR
ALBUMIN: 4.2 g/dL (ref 3.5–5.5)
ALK PHOS: 104 IU/L (ref 39–117)
ALT: 12 IU/L (ref 0–32)
AST: 22 IU/L (ref 0–40)
Albumin/Globulin Ratio: 2.2 (ref 1.2–2.2)
BUN / CREAT RATIO: 36 — AB (ref 9–23)
BUN: 28 mg/dL — AB (ref 6–24)
Bilirubin Total: <0.2 mg/dL (ref 0.0–1.2)
CALCIUM: 9.7 mg/dL (ref 8.7–10.2)
CO2: 26 mmol/L (ref 20–29)
CREATININE: 0.78 mg/dL (ref 0.57–1.00)
Chloride: 103 mmol/L (ref 96–106)
GFR calc Af Amer: 102 mL/min/{1.73_m2} (ref >59)
GFR calc non Af Amer: 88 mL/min/{1.73_m2} (ref >59)
GLUCOSE: 72 mg/dL (ref 65–99)
Globulin, Total: 1.9 g/dL (ref 1.5–4.5)
Potassium: 4.5 mmol/L (ref 3.5–5.2)
Sodium: 142 mmol/L (ref 134–144)
Total Protein: 6.1 g/dL (ref 6.0–8.5)

## 2016-10-29 LAB — LIPID PANEL
CHOLESTEROL TOTAL: 178 mg/dL (ref 100–199)
Chol/HDL Ratio: 2.3 ratio (ref 0.0–4.4)
HDL: 76 mg/dL (ref >39)
LDL CALC: 88 mg/dL (ref 0–99)
TRIGLYCERIDES: 68 mg/dL (ref 0–149)
VLDL CHOLESTEROL CAL: 14 mg/dL (ref 5–40)

## 2016-10-29 LAB — TSH: TSH: 0.062 u[IU]/mL — ABNORMAL LOW (ref 0.450–4.500)

## 2016-11-05 ENCOUNTER — Other Ambulatory Visit: Payer: Self-pay

## 2016-11-11 ENCOUNTER — Telehealth: Payer: Self-pay | Admitting: *Deleted

## 2016-11-11 ENCOUNTER — Ambulatory Visit
Admission: RE | Admit: 2016-11-11 | Discharge: 2016-11-11 | Disposition: A | Discharge status: Home / Self Care | Payer: Medicaid Other | Source: Ambulatory Visit | Attending: Family Medicine | Admitting: Family Medicine

## 2016-11-11 DIAGNOSIS — C7931 Secondary malignant neoplasm of brain: Secondary | ICD-10-CM

## 2016-11-11 MED ORDER — GADOBENATE DIMEGLUMINE 529 MG/ML IV SOLN
13.0000 mL | Freq: Once | INTRAVENOUS | Status: AC | PRN
  Administered 2016-11-11: 13 mL via INTRAVENOUS

## 2016-11-11 NOTE — Telephone Encounter (Signed)
Received fax from Coral Ceo, RN for Dr. Joaquim Lai Collichio needing MRI Brain results.  MRI results faxed to [phone].  Derl Barrow, RN

## 2016-11-12 ENCOUNTER — Telehealth: Payer: Self-pay | Admitting: *Deleted

## 2016-11-12 NOTE — Telephone Encounter (Signed)
Talked to patient earlier today, she will talk to her Oncologist at [facility] ( [place]) to discuss results of her MRI and see if she needs an earlier appointment. She was already scheduled to see them 8/22. Patient has an appointment made with me on 8/24 to discuss Oncology visit and plan going forward.  Marjie Skiff, MD [place], PGY-2

## 2016-11-12 NOTE — Telephone Encounter (Signed)
Patient requesting return call from PCP further explaining MRI results and plan of care. Hubbard Hartshorn, RN, BSN

## 2016-11-25 DIAGNOSIS — C7989 Secondary malignant neoplasm of other specified sites: Secondary | ICD-10-CM | POA: Diagnosis not present

## 2016-11-25 DIAGNOSIS — C7931 Secondary malignant neoplasm of brain: Secondary | ICD-10-CM | POA: Diagnosis not present

## 2016-11-28 ENCOUNTER — Ambulatory Visit (INDEPENDENT_AMBULATORY_CARE_PROVIDER_SITE_OTHER): Payer: Medicaid Other | Admitting: Family Medicine

## 2016-11-28 ENCOUNTER — Encounter: Payer: Self-pay | Admitting: Family Medicine

## 2016-11-28 VITALS — BP 122/68 | HR 78 | Temp 97.7°F | Ht 64.0 in | Wt 139.0 lb

## 2016-11-28 DIAGNOSIS — E039 Hypothyroidism, unspecified: Secondary | ICD-10-CM | POA: Diagnosis not present

## 2016-11-28 DIAGNOSIS — C439 Malignant melanoma of skin, unspecified: Secondary | ICD-10-CM

## 2016-11-28 DIAGNOSIS — C799 Secondary malignant neoplasm of unspecified site: Secondary | ICD-10-CM

## 2016-11-28 NOTE — Patient Instructions (Signed)
It was great seeing you today! We have addressed the following issues today  1. I will check you T4 and T3 and base on the results I will adjust your thyroid medications. 2. Follow up with [facility] as discussed and let me know if you need anything else in between 3. I will stay in touch, arrange a follow up for end of September  If we did any lab work today, and the results require attention, either me or my nurse will get in touch with you. If everything is normal, you will get a letter in mail and a message via . If you don't hear from [place] in two weeks, please give [place] a call. Otherwise, we look forward to seeing you again at your next visit. If you have any questions or concerns before then, please call the clinic at [phone].  Please bring all your medications to every doctors visit  Sign up for My Chart to have easy access to your labs results, and communication with your Primary care physician. Please ask Front Desk for some assistance.   Please check-out at the front desk before leaving the clinic.    Take Care,   Dr. Andy Gauss

## 2016-11-28 NOTE — Progress Notes (Signed)
   Subjective:    Patient ID: Yolanda Krueger, female    DOB: [birth year], 51 y.o.   MRN: [id number]   CC: Follow up on MRI and lab results   HPI: Patient is a 51 yo female with a past medical history significant for malignant Metastatic Melanoma to Head and Neck, hypothyroidism, bipolar disorder, ductal carcinoma s/p bilateral mastectomies depression who presents today to follow up on results from her recent MRI findings and visit with her oncologist at [facility]. Patient reports that she was seen on 8/21 at [facility] cancer center by the Radiation Oncologist (Dr. Phoebe Sharps) and Dr [facility] (Medical oncologist) after a small frontal metastatic mass was found on MRI. Patient is scheduled for [facility] treatment in September at [facility]. Patient had a head and neck CT scan that did not show any evidence of disease. Patient reports that she has been doing well, taking her medications as prescribed. No reports of pain, or need for pain medications. Patient seems optimistic, and would like to continue care with current team.    Smoking status reviewed   ROS: all other systems were reviewed and are negative other than in the HPI   Past Medical History:  Diagnosis Date  . Bipolar 1 disorder ([place])   . Cancer ([place])   . Hypothyroid   . Malignant melanoma, metastatic ([place])   . PTSD (post-traumatic stress disorder)     Past Surgical History:  Procedure Laterality Date  . MELANOMA EXCISION      Past medical history, surgical, family, and social history reviewed and updated in the EMR as appropriate.  Objective:  BP 122/68   Pulse 78   Temp 97.7 F (36.5 C) (Oral)   Ht 5\' 4"  (1.626 m)   Wt 139 lb (63 kg)   LMP  (LMP Unknown)   SpO2 99%   BMI 23.86 kg/m   Vitals and nursing note reviewed  General: NAD, pleasant, able to participate in exam Cardiac: RRR, normal heart sounds, no murmurs. 2+ radial and PT pulses bilaterally Respiratory: CTAB, normal effort, No wheezes, rales or  rhonchi Abdomen: soft, nontender, nondistended, no hepatic or splenomegaly, +BS Extremities: no edema or cyanosis. WWP. Skin: warm and dry, no rashes noted Neuro: alert and oriented x4, no focal deficits Psych: Normal affect and mood   Assessment & Plan:   #Metastatic melanoma to head and neck Mass found on MRI apparently present from prior imaging based on last oncologist visit note. Patient will receive palliative radiation therapy and is scheduled for gamma knife treatment on 9/10. Patient will follow up with medical oncology on 02/24/2017. Patient will have repeat Head and Neck CT scan to monitor for disease recurrence.   #Hypothyroidism, chronic, uncontrolled TSH came back very low at 0.062. Patient is currently on 125 mcg of levothyroxine. Will adjust dosage given results, however would lie to obtain a T3 and T4 level prior to doing so given patient history of head and neck cancer. --Follow up on T4, T3 --Will discuss results with patient and make necessary dosage adjustment  #Smoking cessation Discussed smoking cessation, patient has cut down from 8 cigs to 4 cigs. Patient is using nicotine gum and reports assistance from [facility] team. Offered [facility] clinic cessation program (Dr.Koval), declined for now due to ongoing therapy with [facility].   Marjie Skiff, MD [place] PGY-2

## 2016-11-29 LAB — T4: T4 TOTAL: 7.5 ug/dL (ref 4.5–12.0)

## 2016-11-29 LAB — T3: T3, Total: 98 ng/dL (ref 71–180)

## 2017-02-06 ENCOUNTER — Other Ambulatory Visit: Payer: Self-pay | Admitting: Family Medicine

## 2017-02-25 ENCOUNTER — Ambulatory Visit (INDEPENDENT_AMBULATORY_CARE_PROVIDER_SITE_OTHER): Payer: Medicaid Other | Admitting: *Deleted

## 2017-02-25 DIAGNOSIS — Z23 Encounter for immunization: Secondary | ICD-10-CM

## 2017-04-10 ENCOUNTER — Ambulatory Visit: Payer: Self-pay | Admitting: Family Medicine

## 2017-04-13 ENCOUNTER — Encounter (HOSPITAL_COMMUNITY): Payer: Self-pay | Admitting: Emergency Medicine

## 2017-04-13 ENCOUNTER — Emergency Department (HOSPITAL_COMMUNITY): Payer: Medicaid Other

## 2017-04-13 ENCOUNTER — Other Ambulatory Visit: Payer: Self-pay

## 2017-04-13 ENCOUNTER — Emergency Department (HOSPITAL_COMMUNITY)
Admission: EM | Admit: 2017-04-13 | Discharge: 2017-04-13 | Disposition: A | Discharge status: Home / Self Care | Payer: Medicaid Other | Attending: Emergency Medicine | Admitting: Emergency Medicine

## 2017-04-13 DIAGNOSIS — E039 Hypothyroidism, unspecified: Secondary | ICD-10-CM | POA: Insufficient documentation

## 2017-04-13 DIAGNOSIS — R0602 Shortness of breath: Secondary | ICD-10-CM | POA: Diagnosis present

## 2017-04-13 DIAGNOSIS — Z85820 Personal history of malignant melanoma of skin: Secondary | ICD-10-CM | POA: Insufficient documentation

## 2017-04-13 DIAGNOSIS — Z79899 Other long term (current) drug therapy: Secondary | ICD-10-CM | POA: Insufficient documentation

## 2017-04-13 DIAGNOSIS — R05 Cough: Secondary | ICD-10-CM | POA: Insufficient documentation

## 2017-04-13 DIAGNOSIS — F17210 Nicotine dependence, cigarettes, uncomplicated: Secondary | ICD-10-CM | POA: Diagnosis not present

## 2017-04-13 DIAGNOSIS — J069 Acute upper respiratory infection, unspecified: Secondary | ICD-10-CM | POA: Insufficient documentation

## 2017-04-13 MED ORDER — PREDNISONE 20 MG PO TABS: 40.0000 mg | ORAL_TABLET | Freq: Every day | ORAL | 0 refills | Status: DC

## 2017-04-13 MED ORDER — ALBUTEROL SULFATE HFA 108 (90 BASE) MCG/ACT IN AERS
1.0000 | INHALATION_SPRAY | Freq: Once | RESPIRATORY_TRACT | Status: AC
  Administered 2017-04-13: 2 via RESPIRATORY_TRACT
  Filled 2017-04-13: qty 6.7

## 2017-04-13 MED ORDER — PREDNISONE 20 MG PO TABS
60.0000 mg | ORAL_TABLET | Freq: Once | ORAL | Status: AC
  Administered 2017-04-13: 60 mg via ORAL
  Filled 2017-04-13: qty 3

## 2017-04-13 MED ORDER — ALBUTEROL SULFATE (2.5 MG/3ML) 0.083% IN NEBU
5.0000 mg | INHALATION_SOLUTION | Freq: Once | RESPIRATORY_TRACT | Status: AC
  Administered 2017-04-13: 5 mg via RESPIRATORY_TRACT
  Filled 2017-04-13: qty 6

## 2017-04-13 NOTE — ED Triage Notes (Signed)
Pt states she has a brain tumor and has radiation treatments  Last one she had was before christmas

## 2017-04-13 NOTE — Discharge Instructions (Signed)
Please read attached information. If you experience any new or worsening signs or symptoms please return to the emergency room for evaluation. Please follow-up with your primary care provider or specialist as discussed. Please use medication prescribed only as directed and discontinue taking if you have any concerning signs or symptoms.   °

## 2017-04-13 NOTE — ED Provider Notes (Signed)
[facility] DEPT Provider Note   CSN: [id number] Arrival date & time: 04/13/17  0236     History   Chief Complaint Chief Complaint  Patient presents with  . Shortness of Breath    HPI Yolanda Krueger is a 52 y.o. female.  HPI   52 year old female presents today with complaints of shortness of breath.  Patient notes over the last several days she has had a dry nonproductive cough.  She notes waking up in the middle the night feeling extremely short of breath with cough.  She notes EMS was called and noted to be hypoxic on scene with diminished lung sounds.  She was given albuterol and Atrovent which improved her symptoms.  Patient reports she is feeling much better after the breathing treatments.  She denies a history of the same, reports that she is a longtime smoker with no diagnosed asthma or COPD.  Patient denies any fever at home, reports some soreness along her chest from coughing worse with movements.  Patient denies any lower extremity swelling or edema, history of DVT or PE.  Patient does note a history of metastatic melanoma status post radiation, patient is not receiving chemotherapy.   Past Medical History:  Diagnosis Date  . Bipolar 1 disorder ([place])   . Cancer ([place])   . Hypothyroid   . Malignant melanoma, metastatic ([place])   . PTSD (post-traumatic stress disorder)     Patient Active Problem List   Diagnosis Date Noted  . Abscess of left little finger 03/08/2016  . Acute delirium   . Brain metastases ([place])   . Cerebrovascular disease   . Pressure injury of skin 03/05/2016  . Altered mental status 03/04/2016  . Syncope 03/04/2016  . Metastatic melanoma ([place]) 03/04/2016  . Extensive facial fractures ([place]) 03/04/2016  . Bipolar 1 disorder ([place]) 03/04/2016  . Hypothyroid 03/04/2016  . Acute respiratory failure ([place]) 03/04/2016  . PTSD (post-traumatic stress disorder) 03/04/2016  . Closed fracture of nasal bones   . Confusion      Past Surgical History:  Procedure Laterality Date  . MELANOMA EXCISION      OB History    No data available       Home Medications    Prior to Admission medications   Medication Sig Start Date End Date Taking? Authorizing Provider  clonazePAM (KLONOPIN) 0.5 MG tablet Take 0.5 mg by mouth 2 (two) times daily as needed for anxiety.    [provider]  dronabinol (MARINOL) 5 MG capsule Take 5 mg by mouth 2 (two) times daily as needed (for appetite).    [provider]  DULoxetine (CYMBALTA) 30 MG capsule TAKE 1 CAPSULE (30 MG TOTAL) BY MOUTH 2 (TWO) TIMES DAILY. 02/06/17   Diallo, Earna Coder, MD  levothyroxine (SYNTHROID, LEVOTHROID) 125 MCG tablet Take 125 mcg by mouth daily before breakfast.    [provider]  naproxen (NAPROSYN) 375 MG tablet Take 375 mg by mouth 2 (two) times daily as needed for mild pain.     [provider]  nicotine (NICODERM CQ - DOSED IN MG/24 HOURS) 21 mg/24hr patch Place 21 mg onto the skin daily as needed (smoking cessation).    [provider]  nicotine polacrilex (NICORETTE) 2 MG gum Take 2 mg by mouth every 4 (four) hours as needed for smoking cessation.    [provider]  ondansetron (ZOFRAN ODT) 4 MG disintegrating tablet Take 1 tablet (4 mg total) by mouth every 4 (four) hours as needed  for nausea or vomiting. 07/16/16   Charlesetta Shanks, MD  ondansetron (ZOFRAN ODT) 8 MG disintegrating tablet 8mg  ODT q4 hours prn nausea 06/12/16   Veryl Speak, MD  ondansetron (ZOFRAN) 8 MG tablet Take by mouth every 8 (eight) hours as needed for nausea or vomiting.    [provider]  pantoprazole (PROTONIX) 20 MG tablet Take 1 tablet (20 mg total) by mouth daily. 07/16/16   Charlesetta Shanks, MD  polyethylene glycol (MIRALAX / GLYCOLAX) packet Take 17 g by mouth 2 (two) times daily as needed for mild constipation.    [provider]  potassium chloride SA (K-DUR,KLOR-CON) 20 MEQ tablet Take 1 tablet  (20 mEq total) by mouth 2 (two) times daily. 07/16/16   Charlesetta Shanks, MD  predniSONE (DELTASONE) 20 MG tablet Take 2 tablets (40 mg total) by mouth daily. 04/13/17   Taison Celani, Dellis Filbert, PA-C  pregabalin (LYRICA) 75 MG capsule Take 75 mg by mouth 3 (three) times daily.    [provider]  prochlorperazine (COMPAZINE) 10 MG tablet Take 10 mg by mouth every 6 (six) hours as needed for nausea or vomiting.    [provider]  promethazine (PHENERGAN) 25 MG tablet Take 1 tablet (25 mg total) by mouth every 6 (six) hours as needed for nausea or vomiting. 07/16/16   Charlesetta Shanks, MD  QUEtiapine (SEROQUEL) 100 MG tablet Take 150 mg by mouth at bedtime.     [provider]  senna (SENOKOT) 8.6 MG TABS tablet Take 2 tablets by mouth 2 (two) times daily as needed for mild constipation.     [provider]  traZODone (DESYREL) 50 MG tablet TAKE 1 TABLET BY MOUTH EVERYDAY AT BEDTIME 02/06/17   Marjie Skiff, MD    Family History Family History  Family history unknown: Yes    Social History Social History   Tobacco Use  . Smoking status: Current Every Day Smoker    Types: Cigarettes  . Smokeless tobacco: Never Used  . Tobacco comment:  4 cigs a day. working on quitting  Substance Use Topics  . Alcohol use: No  . Drug use: No    Comment: denies     Allergies   Penicillins   Review of Systems Review of Systems  All other systems reviewed and are negative.    Physical Exam Updated Vital Signs BP 115/70   Pulse 91   Temp 97.7 F (36.5 C) (Oral)   Resp (!) 21   LMP  (LMP Unknown)   SpO2 97%   Physical Exam  Constitutional: She is oriented to person, place, and time. She appears well-developed and well-nourished.  HENT:  Head: Normocephalic and atraumatic.  Eyes: Conjunctivae are normal. Pupils are equal, round, and reactive to light. Right eye exhibits no discharge. Left eye exhibits no discharge. No scleral icterus.  Neck: Normal range of  motion. No JVD present. No tracheal deviation present.  Pulmonary/Chest: Effort normal. No stridor.  Coarse breath sounds   Neurological: She is alert and oriented to person, place, and time. Coordination normal.  Psychiatric: She has a normal mood and affect. Her behavior is normal. Judgment and thought content normal.  Nursing note and vitals reviewed.    ED Treatments / Results  Labs (all labs ordered are listed, but only abnormal results are displayed) Labs Reviewed - No data to display  EKG  EKG Interpretation  Date/Time:  Monday April 13 2017 03:05:28 EST Ventricular Rate:  92 PR Interval:    QRS Duration: 82 QT  Interval:  372 QTC Calculation: 461 R Axis:   77 Text Interpretation:  Sinus rhythm No significant change was found Confirmed by Shanon Rosser [phone]) on 04/13/2017 4:40:50 AM       Radiology Dg Chest 2 View  Result Date: 04/13/2017 CLINICAL DATA:  Awoke with shortness of breath and cough. EXAM: CHEST  2 VIEW COMPARISON:  Radiographs 07/07/2016 FINDINGS: The cardiomediastinal contours are normal. The lungs are clear. Pulmonary vasculature is normal. No consolidation, pleural effusion, or pneumothorax. No acute osseous abnormalities are seen. IMPRESSION: No acute pulmonary process. Electronically Signed   By: Jeb Levering M.D.   On: 04/13/2017 04:09    Procedures Procedures (including critical care time)  Medications Ordered in ED Medications  albuterol (PROVENTIL) (2.5 MG/3ML) 0.083% nebulizer solution 5 mg (5 mg Nebulization Given 04/13/17 0443)  predniSONE (DELTASONE) tablet 60 mg (60 mg Oral Given 04/13/17 0527)  albuterol (PROVENTIL HFA;VENTOLIN HFA) 108 (90 Base) MCG/ACT inhaler 1-2 puff (2 puffs Inhalation Given 04/13/17 0618)     Initial Impression / Assessment and Plan / ED Course  I have reviewed the triage vital signs and the nursing notes.  Pertinent labs & imaging results that were available during my care of the patient were reviewed by me and  considered in my medical decision making (see chart for details).     Final Clinical Impressions(s) / ED Diagnoses   Final diagnoses:  SOB (shortness of breath)  Upper respiratory tract infection, unspecified type    Labs:   Imaging: DG chest 2 view  Consults:  Therapeutics: Prednisone, albuterol  Discharge Meds: Prednisone  Assessment/Plan: 52 year old female presents today with complaints of shortness of breath.  This is likely secondary to upper respiratory infection.  She is well-appearing in no acute distress with reassuring vital signs.  Patient has coarse lung sounds, no acute rails or wheeze.  Breathing treatment prior to my evaluation significantly improved her symptoms.  Now without any significant chest pain other than soreness in the chest from coughing that is reproducible.  Patient monitored here in the ED without rebound of symptoms, she will be discharged home on steroids, breathing treatment and strict return precautions.  I have very low suspicion for any other acute process.  Patient verbalized understanding and agreement to today's plan had no further questions or concerns at the time of discharge.   ED Discharge Orders        Ordered    predniSONE (DELTASONE) 20 MG tablet  Daily     04/13/17 0655       Okey Regal, PA-C 04/13/17 0655    Okey Regal, PA-C 04/13/17 0656    Molpus, Jenny Reichmann, MD 04/13/17 0730

## 2017-04-13 NOTE — ED Notes (Signed)
No respiratory or acute distress noted alert and oriented x 3 no reaction to medication noted states feels better able to speak in full sentences.

## 2017-04-13 NOTE — ED Triage Notes (Signed)
Pt brought in by EMS from home  PT states she woke up at 0130 in resp distress, coughing and could not breathe  Fire reported pt had a sat of 84% on room air  Breath sounds diminished bilaterally   Albuterol 10mg /Atrovent 0.5mg  given by EMS  Pt had some relief

## 2017-04-17 ENCOUNTER — Ambulatory Visit: Payer: Self-pay | Admitting: Family Medicine

## 2017-05-04 ENCOUNTER — Ambulatory Visit (INDEPENDENT_AMBULATORY_CARE_PROVIDER_SITE_OTHER): Payer: Medicaid Other | Admitting: Family Medicine

## 2017-05-04 ENCOUNTER — Encounter: Payer: Self-pay | Admitting: Psychology

## 2017-05-04 ENCOUNTER — Encounter: Payer: Self-pay | Admitting: Family Medicine

## 2017-05-04 ENCOUNTER — Other Ambulatory Visit: Payer: Self-pay

## 2017-05-04 VITALS — BP 102/60 | HR 86 | Temp 97.9°F | Ht 64.0 in | Wt 140.0 lb

## 2017-05-04 DIAGNOSIS — F319 Bipolar disorder, unspecified: Secondary | ICD-10-CM

## 2017-05-04 DIAGNOSIS — E039 Hypothyroidism, unspecified: Secondary | ICD-10-CM | POA: Diagnosis not present

## 2017-05-04 MED ORDER — QUETIAPINE FUMARATE 100 MG PO TABS: 150.0000 mg | ORAL_TABLET | Freq: Every day | ORAL | 1 refills | Status: DC

## 2017-05-04 MED ORDER — NAPROXEN 375 MG PO TABS
375.0000 mg | ORAL_TABLET | Freq: Two times a day (BID) | ORAL | 1 refills | Status: DC | PRN
Start: 2017-05-04 — End: 2017-07-10

## 2017-05-04 MED ORDER — POLYETHYLENE GLYCOL 3350 17 G PO PACK: 17.0000 g | PACK | Freq: Two times a day (BID) | ORAL | 2 refills | Status: DC | PRN

## 2017-05-04 NOTE — Patient Instructions (Signed)
It was great seeing you today! We have addressed the following issues today  1. Please call [facility] oncology to follow up on the results from the MRI. 2. I will refer you to our smoking cessation team. 3. I refilled your medications as discussed. 4. I will follow up on the results of your thyroid test   If we did any lab work today, and the results require attention, either me or my nurse will get in touch with you. If everything is normal, you will get a letter in mail and a message via . If you don't hear from [place] in two weeks, please give [place] a call. Otherwise, we look forward to seeing you again at your next visit. If you have any questions or concerns before then, please call the clinic at [phone].  Please bring all your medications to every doctors visit  Sign up for My Chart to have easy access to your labs results, and communication with your Primary care physician. Please ask Front Desk for some assistance.   Please check-out at the front desk before leaving the clinic.    Take Care,   Dr. Andy Gauss
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
## 2017-05-04 NOTE — Progress Notes (Signed)
   Subjective:    Patient ID: Yolanda Krueger, female    DOB: Jul 16, 1965, 52 y.o.   MRN: [id number]   CC: Follow up on Metastatic Melanoma  HPI: Patient is a 52 yo female with a past medical history significant for malignant Metastatic Melanoma to Head and Neck, hypothyroidism, bipolar disorder, ductal carcinoma s/p bilateral mastectomiesdepression who presents today to followup on likely recurrence of her melanoma. At last visit, patient was supposed to follow-up with [facility] oncology team for radiation after MRI showed what appears to be a metastasis to the right frontal lobe of her melanoma.  Patient never admitted to her appointment in November due to transportation issues.  Patient is here requesting repeat MRI in the hope that right frontal lobe mass had resolved.  Patient continued to complains of unilateral right-sided headache with some vision changes patient also endorses significant night sweats for the past 2 weeks but denies any weight loss.  Patient denies any chest pain, shortness of breath, nausea, vomiting, fever or chills.  Smoking status reviewed   ROS: all other systems were reviewed and are negative other than in the HPI   Past Medical History:  Diagnosis Date  . Bipolar 1 disorder ([place])   . Cancer ([place])   . Hypothyroid   . Malignant melanoma, metastatic ([place])   . PTSD (post-traumatic stress disorder)     Past Surgical History:  Procedure Laterality Date  . MELANOMA EXCISION      Past medical history, surgical, family, and social history reviewed and updated in the EMR as appropriate.  Objective:  BP 102/60   Pulse 86   Temp 97.9 F (36.6 C) (Oral)   Ht 5\' 4"  (1.626 m)   Wt 140 lb (63.5 kg)   LMP  (LMP Unknown)   SpO2 98%   BMI 24.03 kg/m   Vitals and nursing note reviewed  General: NAD, pleasant, able to participate in exam Cardiac: RRR, normal heart sounds, no murmurs. 2+ radial and PT pulses bilaterally Respiratory: CTAB, normal effort, No wheezes,  rales or rhonchi Abdomen: soft, nontender, nondistended, no hepatic or splenomegaly, +BS Extremities: no edema or cyanosis. WWP. Skin: warm and dry, no rashes noted Neuro: alert and oriented x4, no focal deficits Psych: Normal affect and mood   Assessment & Plan:    #Right frontal lobe mass secondary to metastasis from melanoma Patient reporting symptoms of headache, vision change, night sweats in the setting of her recent MRI findings showing likely metastasis from melanoma to her right frontal lobe.  Patient missed appointment for radiation therapy back in November due to fear of significant headache following treatment.  Insisted that patient call [facility] to schedule follow-up appointment with oncology team.  Explained to patient that the repeat MRI would not be necessary at today's visit and that expectation that the mass has improved is likely given symptoms described by patient.  We will follow-up with patient in the next 2 days to make sure that the appointment was made and the patient is not lost to follow-up for what could be fatal if that address quickly.  Will repeat TSH, T3-T4 given history of hypothyroidism with subclinical diagnosis at last office visit.    Marjie Skiff, MD [place] PGY-2

## 2017-05-05 LAB — T4, FREE: Free T4: 1.86 ng/dL — ABNORMAL HIGH (ref 0.82–1.77)

## 2017-05-05 LAB — TSH: TSH: 0.071 u[IU]/mL — AB (ref 0.450–4.500)

## 2017-05-05 LAB — T3, FREE: T3, Free: 3 pg/mL (ref 2.0–4.4)

## 2017-05-12 ENCOUNTER — Other Ambulatory Visit: Payer: Self-pay | Admitting: Family Medicine

## 2017-05-12 NOTE — Telephone Encounter (Signed)
Would like results from lab work done last week.  Please call her

## 2017-05-12 NOTE — Telephone Encounter (Signed)
Page,   Please would let patient know that I want her to increase her Levothyroxine from 125 mcg daily to 150 mcg. Her thyroid hormone level was low. I will call her thursday when I am in clinic.  Thank you  Marjie Skiff, MD [place], PGY-2

## 2017-05-15 ENCOUNTER — Other Ambulatory Visit: Payer: Self-pay | Admitting: Family Medicine

## 2017-05-15 MED ORDER — LEVOTHYROXINE SODIUM 150 MCG PO TABS: 150.0000 ug | ORAL_TABLET | Freq: Every day | ORAL | 0 refills | Status: DC

## 2017-05-15 NOTE — Telephone Encounter (Signed)
Pt contacted and informed of lab result and need for dosage change. Pt verbalized understanding. Please send in script for 158mcg. Thanks!

## 2017-05-18 ENCOUNTER — Encounter: Payer: Self-pay | Admitting: Psychology

## 2017-05-18 ENCOUNTER — Ambulatory Visit: Payer: Self-pay

## 2017-05-18 NOTE — Progress Notes (Signed)
I called Yolanda Krueger to follow up, as she canceled her appointment for today. When I called, the phone would ring one time and then go dead, so I could not leave a message despite calling several times.

## 2017-05-19 DIAGNOSIS — C7931 Secondary malignant neoplasm of brain: Secondary | ICD-10-CM | POA: Diagnosis not present

## 2017-06-05 ENCOUNTER — Other Ambulatory Visit: Payer: Self-pay | Admitting: Family Medicine

## 2017-06-15 DIAGNOSIS — C7931 Secondary malignant neoplasm of brain: Secondary | ICD-10-CM | POA: Diagnosis not present

## 2017-06-29 DIAGNOSIS — C7931 Secondary malignant neoplasm of brain: Secondary | ICD-10-CM | POA: Diagnosis not present

## 2017-07-10 ENCOUNTER — Inpatient Hospital Stay: Payer: Medicaid Other | Admitting: Family Medicine

## 2017-07-10 ENCOUNTER — Other Ambulatory Visit: Payer: Self-pay | Admitting: Family Medicine

## 2017-07-13 ENCOUNTER — Other Ambulatory Visit: Payer: Self-pay | Admitting: Family Medicine

## 2017-07-17 ENCOUNTER — Other Ambulatory Visit: Payer: Self-pay

## 2017-07-17 ENCOUNTER — Encounter: Payer: Self-pay | Admitting: Family Medicine

## 2017-07-17 ENCOUNTER — Ambulatory Visit (INDEPENDENT_AMBULATORY_CARE_PROVIDER_SITE_OTHER): Payer: Medicaid Other | Admitting: Family Medicine

## 2017-07-17 VITALS — BP 100/70 | HR 83 | Temp 98.3°F | Ht 64.0 in | Wt 147.0 lb

## 2017-07-17 DIAGNOSIS — C7931 Secondary malignant neoplasm of brain: Secondary | ICD-10-CM

## 2017-07-17 DIAGNOSIS — K5903 Drug induced constipation: Secondary | ICD-10-CM

## 2017-07-17 DIAGNOSIS — C799 Secondary malignant neoplasm of unspecified site: Secondary | ICD-10-CM

## 2017-07-17 DIAGNOSIS — C439 Malignant melanoma of skin, unspecified: Secondary | ICD-10-CM

## 2017-07-17 MED ORDER — POLYETHYLENE GLYCOL 3350 17 G PO PACK: 17.0000 g | PACK | Freq: Two times a day (BID) | ORAL | 2 refills | Status: DC | PRN

## 2017-07-17 MED ORDER — OXYCODONE HCL 5 MG PO CAPS: 5.0000 mg | ORAL_CAPSULE | ORAL | 0 refills | Status: DC | PRN

## 2017-07-17 NOTE — Progress Notes (Signed)
hfu

## 2017-07-17 NOTE — Assessment & Plan Note (Addendum)
Patient is here today to follow-up on recent right frontal craniotomy for removal of 2 metastatic mass secondary to melanoma.  Patient has been doing well since discharge from hospital.  Has been using pain medication appropriately for headache.  Exam is unremarkable.  Incision healing.  Patient is scheduled for radiation therapy at [facility] next week (4/15).  Will follow up with me in clinic in the next few weeks. --Refilled oxycodone 5 mg-every 4 as needed --Refill MiraLAX --We will see patient after the radiation therapy

## 2017-07-17 NOTE — Progress Notes (Signed)
   Subjective:    Patient ID: Yolanda Krueger, female    DOB: 1965-08-02, 52 y.o.   MRN: [id number]   CC: Follow for melanoma with brain metastasis  HPI: Patient is a 52 year old female with a past medical history significant for malignant metastasis melanoma head and neck, hepatitis, bipolar disorder, ductal carcinoma status post bilateral mastectomy, depression who presents today to follow-up on recent removal of brain metastasis.  Patient states that she had brain surgery on 06/25/2017 at [facility] for removal of 2 small masses seen on MRI in the fall 2018. Patient has been doing well since discharge from [facility].  Patient continued to complain of severe frontal headache on the right side for which she has been taking oxycodone 5 mg every 4 hours.  Patient endorses being adherent to her medication regimen.  Patient will follow up surgery with radiation therapy scheduled to start on 4/15.  Patient states that treatment will last 5 days and will take place at [facility].  She currently has no acute complaint.  She denies chest pain, abdominal pain, shortness of breath, nausea, vomiting, dizziness.  Smoking status reviewed   ROS: all other systems were reviewed and are negative other than in the HPI   Past Medical History:  Diagnosis Date  . Bipolar 1 disorder ([place])   . Cancer ([place])   . Hypothyroid   . Malignant melanoma, metastatic ([place])   . PTSD (post-traumatic stress disorder)     Past Surgical History:  Procedure Laterality Date  . MELANOMA EXCISION      Past medical history, surgical, family, and social history reviewed and updated in the EMR as appropriate.  Objective:  BP 100/70   Pulse 83   Temp 98.3 F (36.8 C) (Oral)   Ht 5\' 4"  (1.626 m)   Wt 147 lb (66.7 kg)   LMP  (LMP Unknown)   SpO2 98%   BMI 25.23 kg/m   Vitals and nursing note reviewed  General: NAD, pleasant, able to participate in exam HEENT: Incision noted on right frontal lobe around hairline. Healing  appropriately no drainage, erythema noted.  Minimal swelling. Cardiac: RRR, normal heart sounds, no murmurs. 2+ radial and PT pulses bilaterally Respiratory: CTAB, normal effort, No wheezes, rales or rhonchi Abdomen: soft, nontender, nondistended, no hepatic or splenomegaly, +BS Extremities: no edema or cyanosis. WWP. Skin: warm and dry, no rashes noted Neuro: alert and oriented x4, no focal deficits Psych: Normal affect and mood   Assessment & Plan:    Metastatic melanoma ([place]) Patient is here today to follow-up on recent right frontal craniotomy for removal of 2 metastatic mass secondary to melanoma.  Patient has been doing well since discharge from hospital.  Has been using pain medication appropriately for headache.  Exam is unremarkable.  Incision healing.  Patient is scheduled for radiation therapy at [facility] next week (4/15).  Will follow up with me in clinic in the next few weeks. --Refilled oxycodone 5 mg-every 4 as needed --Refill MiraLAX --We will see patient after the radiation therapy    Marjie Skiff, MD [place] PGY-2

## 2017-07-17 NOTE — Patient Instructions (Signed)
It was great seeing you today! We have addressed the following issues today  1. I am glad you are doing better. Keep me posted after your radiation sessions. 2. I would like to see you back in a month or so make sure you are progressing.  If we did any lab work today, and the results require attention, either me or my nurse will get in touch with you. If everything is normal, you will get a letter in mail and a message via . If you don't hear from [place] in two weeks, please give [place] a call. Otherwise, we look forward to seeing you again at your next visit. If you have any questions or concerns before then, please call the clinic at [phone].  Please bring all your medications to every doctors visit  Sign up for My Chart to have easy access to your labs results, and communication with your Primary care physician. Please ask Front Desk for some assistance.   Please check-out at the front desk before leaving the clinic.    Take Care,   Dr. Andy Gauss

## 2017-07-18 ENCOUNTER — Other Ambulatory Visit: Payer: Self-pay | Admitting: Family Medicine

## 2017-08-04 ENCOUNTER — Other Ambulatory Visit: Payer: Self-pay | Admitting: Family Medicine

## 2017-08-19 ENCOUNTER — Other Ambulatory Visit: Payer: Self-pay | Admitting: Family Medicine

## 2017-08-19 DIAGNOSIS — C7931 Secondary malignant neoplasm of brain: Secondary | ICD-10-CM

## 2017-08-19 MED ORDER — OXYCODONE HCL 5 MG PO CAPS: 5.0000 mg | ORAL_CAPSULE | ORAL | 0 refills | Status: DC | PRN

## 2017-08-21 ENCOUNTER — Telehealth: Payer: Self-pay

## 2017-08-21 NOTE — Telephone Encounter (Signed)
Dr. Joaquim Lai Collichio called- requesting a call back from Dr. Andy Gauss on Monday (08/24/17). Wants to give some background info on pt. Call back [phone] Wallace Cullens, RN

## 2017-08-25 NOTE — Telephone Encounter (Signed)
Dr. Harriet Masson called again requesting to speak with Dr. Andy Gauss today regarding patient. She left her cell number [phone].  Wallace Cullens, RN

## 2017-08-25 NOTE — Telephone Encounter (Signed)
Discussed with Dr. Dwyane Luo plan for this patient this morning.  Thank you   Marjie Skiff, MD [place], PGY-2

## 2017-09-01 ENCOUNTER — Other Ambulatory Visit: Payer: Self-pay | Admitting: Family Medicine

## 2017-09-01 DIAGNOSIS — C7931 Secondary malignant neoplasm of brain: Secondary | ICD-10-CM

## 2017-09-01 MED ORDER — PREGABALIN 75 MG PO CAPS: 75.0000 mg | ORAL_CAPSULE | Freq: Three times a day (TID) | ORAL | 0 refills | Status: DC

## 2017-09-01 MED ORDER — CYCLOBENZAPRINE HCL 5 MG PO TABS: 5.0000 mg | ORAL_TABLET | Freq: Two times a day (BID) | ORAL | Status: DC | PRN

## 2017-09-01 NOTE — Telephone Encounter (Signed)
Still having pain in head. appt in [place] isnt until June 24.  Pt wants refill on oxycodone. She also needs lyrica, flexeril refilled. [facility] [address].

## 2017-09-05 ENCOUNTER — Other Ambulatory Visit: Payer: Self-pay | Admitting: Family Medicine

## 2017-09-15 ENCOUNTER — Ambulatory Visit: Payer: Self-pay | Admitting: Family Medicine

## 2017-09-15 ENCOUNTER — Other Ambulatory Visit: Payer: Self-pay | Admitting: Family Medicine

## 2017-10-07 ENCOUNTER — Encounter: Payer: Self-pay | Admitting: Family Medicine

## 2017-10-07 ENCOUNTER — Ambulatory Visit (INDEPENDENT_AMBULATORY_CARE_PROVIDER_SITE_OTHER): Payer: Medicaid Other | Admitting: Family Medicine

## 2017-10-07 DIAGNOSIS — E039 Hypothyroidism, unspecified: Secondary | ICD-10-CM

## 2017-10-07 DIAGNOSIS — K5903 Drug induced constipation: Secondary | ICD-10-CM | POA: Diagnosis present

## 2017-10-07 MED ORDER — POLYETHYLENE GLYCOL 3350 17 G PO PACK: 17.0000 g | PACK | Freq: Two times a day (BID) | ORAL | 2 refills | Status: DC | PRN

## 2017-10-07 MED ORDER — NAPROXEN 375 MG PO TABS: 375.0000 mg | ORAL_TABLET | Freq: Two times a day (BID) | ORAL | 1 refills | Status: DC | PRN

## 2017-10-07 MED ORDER — LEVETIRACETAM 1000 MG PO TABS: ORAL_TABLET | ORAL | 0 refills | Status: DC

## 2017-10-07 NOTE — Progress Notes (Signed)
   Subjective:    Patient ID: Yolanda Krueger, female    DOB: 1965/07/14, 52 y.o.   MRN: [id number]   CC: Medication refill and MRI result  HPI: Patient is a 52 year old female past medical history significant for malignant metastatic melanoma head and neck, hepatitis, bipolar disorder, ductal carcinoma status post bilateral mastectomy, depression who presents today to follow-up on recent MRI results.  Patient reports that she has been experiencing mild headache with hair loss since last office visit.  Patient recently had undergone MRI to follow-up on recent resection of brain mass.  Patient continued to request oxycodone as well as Flexeril.  She has been using naproxen on a as needed basis.  Appetite is improving with intermittent anorexia.  She currently denies any chest pain, abdominal pain, shortness of breath, vision changes, fever, chills.  Smoking status reviewed   ROS: all other systems were reviewed and are negative other than in the HPI   Past Medical History:  Diagnosis Date  . Bipolar 1 disorder ([place])   . Cancer ([place])   . Hypothyroid   . Malignant melanoma, metastatic ([place])   . PTSD (post-traumatic stress disorder)     Past Surgical History:  Procedure Laterality Date  . MELANOMA EXCISION      Past medical history, surgical, family, and social history reviewed and updated in the EMR as appropriate.  Objective:  BP 100/70   Pulse 88   Temp 98.2 F (36.8 C)   Wt 145 lb (65.8 kg)   LMP  (LMP Unknown)   SpO2 94%   BMI 24.89 kg/m   Vitals and nursing note reviewed  General: NAD, pleasant, able to participate in exam HEENT: Significant hair loss noted around frontal incision site.  Scar healing well.  No chills or defects.  No change in hearing. Cardiac: RRR, normal heart sounds, no murmurs. 2+ radial and PT pulses bilaterally Respiratory: CTAB, normal effort, No wheezes, rales or rhonchi Abdomen: soft, nontender, nondistended, no hepatic or splenomegaly,  +BS Extremities: no edema or cyanosis. WWP. 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  Findings showed no new enhancing lesion identified on MRI.  Evidence of right frontal lobe metastasis resection.  Decreased peripheral enhancement compared to prior MRI.  Discussed reassuring results with patient.  She has follow-up appointment with radiation oncology and medical oncologist on 10/22/2016 when she will discuss in greater detail results and prognosis.  Patient will discuss with oncologist pain management as she continues to request opioids for pain control. --Refill naproxen 375 mg twice daily as needed --No refill of Flexeril or oxycodone discussed with medical oncologist  appropriate pain control regimen.  --Refill Keppra   Marjie Skiff, MD [place] PGY-3

## 2017-10-07 NOTE — Assessment & Plan Note (Deleted)
[id number]

## 2017-10-07 NOTE — Patient Instructions (Signed)
It was great seeing you today! We have addressed the following issues today  1. Your MRI results are normal. No new findings but they will talk to you more about it when you see your cancer doctor on 10/22/2017. 2. Refilled your medications 3. I will see you after your appointment at [facility].  If we did any lab work today, and the results require attention, either me or my nurse will get in touch with you. If everything is normal, you will get a letter in mail and a message via . If you don't hear from [place] in two weeks, please give [place] a call. Otherwise, we look forward to seeing you again at your next visit. If you have any questions or concerns before then, please call the clinic at [phone].  Please bring all your medications to every doctors visit  Sign up for My Chart to have easy access to your labs results, and communication with your Primary care physician. Please ask Front Desk for some assistance.   Please check-out at the front desk before leaving the clinic.    Take Care,   Dr. Andy Gauss

## 2017-10-14 ENCOUNTER — Other Ambulatory Visit: Payer: Self-pay | Admitting: Family Medicine

## 2017-10-16 ENCOUNTER — Other Ambulatory Visit: Payer: Self-pay | Admitting: Family Medicine

## 2017-10-22 DIAGNOSIS — C7931 Secondary malignant neoplasm of brain: Secondary | ICD-10-CM | POA: Diagnosis not present

## 2017-11-02 ENCOUNTER — Other Ambulatory Visit: Payer: Self-pay | Admitting: Family Medicine

## 2017-11-09 ENCOUNTER — Ambulatory Visit: Payer: Medicaid Other | Admitting: Family Medicine

## 2017-11-18 ENCOUNTER — Other Ambulatory Visit: Payer: Self-pay | Admitting: Family Medicine

## 2017-11-18 NOTE — Telephone Encounter (Signed)
Pharmacy she has no refill on her thyroid medication[facility] [address]

## 2017-11-30 ENCOUNTER — Ambulatory Visit (INDEPENDENT_AMBULATORY_CARE_PROVIDER_SITE_OTHER): Payer: Medicaid Other | Admitting: Family Medicine

## 2017-11-30 ENCOUNTER — Other Ambulatory Visit: Payer: Self-pay

## 2017-11-30 ENCOUNTER — Encounter: Payer: Self-pay | Admitting: Family Medicine

## 2017-11-30 VITALS — BP 118/70 | HR 76 | Temp 98.0°F | Ht 64.0 in | Wt 143.0 lb

## 2017-11-30 DIAGNOSIS — C799 Secondary malignant neoplasm of unspecified site: Secondary | ICD-10-CM | POA: Diagnosis not present

## 2017-11-30 DIAGNOSIS — E039 Hypothyroidism, unspecified: Secondary | ICD-10-CM

## 2017-11-30 DIAGNOSIS — Z23 Encounter for immunization: Secondary | ICD-10-CM

## 2017-11-30 DIAGNOSIS — Z114 Encounter for screening for human immunodeficiency virus [HIV]: Secondary | ICD-10-CM | POA: Diagnosis not present

## 2017-11-30 DIAGNOSIS — C439 Malignant melanoma of skin, unspecified: Secondary | ICD-10-CM

## 2017-11-30 NOTE — Assessment & Plan Note (Addendum)
Patient doing well, headaches are less severe and she will continue with naproxen. 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 Will have follow up MRI brain and Chest in November. She will follow up with me after that. Will continue to monitor symptoms. Will check basic labs today.  --Order CBC, CMP, lipid panel

## 2017-11-30 NOTE — Progress Notes (Signed)
   Subjective:    Patient ID: Yolanda Krueger, female    DOB: [birth year], 52 y.o.   MRN: [id number]   CC: Follow up for metastatic melanoma  HPI: Patient is a 52 yo female with a past medical history significant for malignant metastatic melanoma head and neck, hepatitis, bipolar disorder, ductal carcinoma status post bilateral mastectomy, depression who presents today to follow up on metastatic melanoma to brain and lung s/p craniotomy. Patient was seen by Medical Oncologist and Radiation Oncology on 7/18 for a follow of her recent procedures. Patient is doing well, headaches are much improved and she is currently on Naproxen that was refilled at [facility] during last office visit. Patient also had some neuropathy from [place] k surgery and is currently taking Lyrica three times a day. Hair is growing around incision site from craniotomy. Patient is scheduled for interval MRI brain and lung to follow up on disease progression. Patient denies chest pain, SOB, abdominal pain, nausea and vomiting, vision changes.   Smoking status reviewed   ROS: all other systems were reviewed and are negative other than in the HPI   Past Medical History:  Diagnosis Date  . Bipolar 1 disorder ([place])   . Cancer ([place])   . Hypothyroid   . Malignant melanoma, metastatic ([place])   . PTSD (post-traumatic stress disorder)     Past Surgical History:  Procedure Laterality Date  . MELANOMA EXCISION      Past medical history, surgical, family, and social history reviewed and updated in the EMR as appropriate.  Objective:  BP 118/70   Pulse 76   Temp 98 F (36.7 C) (Oral)   Ht 5\' 4"  (1.626 m)   Wt 143 lb (64.9 kg)   LMP  (LMP Unknown)   SpO2 98%   BMI 24.55 kg/m   Vitals and nursing note reviewed  General: NAD, pleasant, able to participate in exam Cardiac: RRR, normal heart sounds, no murmurs. 2+ radial and PT pulses bilaterally Respiratory: CTAB, normal effort, No wheezes, rales or rhonchi Abdomen: soft, nontender,  nondistended, no hepatic or splenomegaly, +BS Extremities: no edema or cyanosis. WWP. Skin: warm and dry, no rashes noted Neuro: alert and oriented x4, no focal deficits Psych: Normal affect and mood   Assessment & Plan:   Metastatic melanoma ([place]) Patient doing well, headaches are less severe and she will continue with naproxen. 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 Will have follow up MRI brain and Chest in November. She will follow up with me after that. Will continue to monitor symptoms. Will check basic labs today.  --Order CBC, CMP, lipid panel  Hypothyroid Patient has a history of hypothyroidism. Last TSH was 0.072 with mildly elevated T4. Patient with history of brain radiation. Clinical picture picture more consistent hyperthyroidism. No tachycardia, exophthalmos, difficulty gaining weight, anxiety, tremor.  --Recheck TSH and free T4  Health maintenance: Flu shot today and HIV screening.   Marjie Skiff, MD [place] PGY-3

## 2017-11-30 NOTE — Patient Instructions (Addendum)
It was great seeing you today! We have addressed the following issues today  1. We will do blood work today and I will follow up on the results  2. I will try to get the record of you colonoscopy from [facility]. 3. I will see you after you [facility] appointments in November or sooner as needed. 4. I will schedule you for a pap smear at next office visit.  If we did any lab work today, and the results require attention, either me or my nurse will get in touch with you. If everything is normal, you will get a letter in mail and a message via . If you don't hear from [place] in two weeks, please give [place] a call. Otherwise, we look forward to seeing you again at your next visit. If you have any questions or concerns before then, please call the clinic at [phone].  Please bring all your medications to every doctors visit  Sign up for My Chart to have easy access to your labs results, and communication with your Primary care physician. Please ask Front Desk for some assistance.   Please check-out at the front desk before leaving the clinic.    Take Care,   Dr. Andy Gauss

## 2017-11-30 NOTE — Assessment & Plan Note (Signed)
Patient has a history of hypothyroidism. Last TSH was 0.072 with mildly elevated T4. Patient with history of brain radiation. Clinical picture picture more consistent hyperthyroidism. No tachycardia, exophthalmos, difficulty gaining weight, anxiety, tremor.  --Recheck TSH and free T4

## 2017-12-01 LAB — LIPID PANEL
Chol/HDL Ratio: 2.8 ratio (ref 0.0–4.4)
Cholesterol, Total: 219 mg/dL — ABNORMAL HIGH (ref 100–199)
HDL: 79 mg/dL (ref >39)
LDL Calculated: 126 mg/dL — ABNORMAL HIGH (ref 0–99)
TRIGLYCERIDES: 71 mg/dL (ref 0–149)
VLDL Cholesterol Cal: 14 mg/dL (ref 5–40)

## 2017-12-01 LAB — CBC WITH DIFFERENTIAL/PLATELET
Basophils Absolute: 0 10*3/uL (ref 0.0–0.2)
Basos: 0 %
EOS (ABSOLUTE): 0.1 10*3/uL (ref 0.0–0.4)
Eos: 3 %
Hematocrit: 39.1 % (ref 34.0–46.6)
Hemoglobin: 13 g/dL (ref 11.1–15.9)
IMMATURE GRANULOCYTES: 0 %
Immature Grans (Abs): 0 10*3/uL (ref 0.0–0.1)
Lymphocytes Absolute: 1 10*3/uL (ref 0.7–3.1)
Lymphs: 26 %
MCH: 30.7 pg (ref 26.6–33.0)
MCHC: 33.2 g/dL (ref 31.5–35.7)
MCV: 92 fL (ref 79–97)
MONOS ABS: 0.2 10*3/uL (ref 0.1–0.9)
Monocytes: 6 %
NEUTROS PCT: 65 %
Neutrophils Absolute: 2.5 10*3/uL (ref 1.4–7.0)
PLATELETS: 256 10*3/uL (ref 150–450)
RBC: 4.24 x10E6/uL (ref 3.77–5.28)
RDW: 14.6 % (ref 12.3–15.4)
WBC: 3.9 10*3/uL (ref 3.4–10.8)

## 2017-12-01 LAB — CMP14+EGFR
A/G RATIO: 1.9 (ref 1.2–2.2)
ALBUMIN: 4.4 g/dL (ref 3.5–5.5)
ALT: 9 IU/L (ref 0–32)
AST: 18 IU/L (ref 0–40)
Alkaline Phosphatase: 135 IU/L — ABNORMAL HIGH (ref 39–117)
BILIRUBIN TOTAL: 0.2 mg/dL (ref 0.0–1.2)
BUN / CREAT RATIO: 40 — AB (ref 9–23)
BUN: 28 mg/dL — AB (ref 6–24)
CALCIUM: 9.8 mg/dL (ref 8.7–10.2)
CHLORIDE: 104 mmol/L (ref 96–106)
CO2: 24 mmol/L (ref 20–29)
Creatinine, Ser: 0.7 mg/dL (ref 0.57–1.00)
GFR calc non Af Amer: 100 mL/min/{1.73_m2} (ref >59)
GFR, EST AFRICAN AMERICAN: 115 mL/min/{1.73_m2} (ref >59)
GLUCOSE: 79 mg/dL (ref 65–99)
Globulin, Total: 2.3 g/dL (ref 1.5–4.5)
Potassium: 4.3 mmol/L (ref 3.5–5.2)
Sodium: 140 mmol/L (ref 134–144)
TOTAL PROTEIN: 6.7 g/dL (ref 6.0–8.5)

## 2017-12-01 LAB — HIV ANTIBODY (ROUTINE TESTING W REFLEX): HIV Screen 4th Generation wRfx: NONREACTIVE

## 2017-12-01 LAB — TSH

## 2017-12-01 LAB — T4, FREE: FREE T4: 2.04 ng/dL — AB (ref 0.82–1.77)

## 2017-12-08 ENCOUNTER — Other Ambulatory Visit: Payer: Self-pay | Admitting: Family Medicine

## 2017-12-08 MED ORDER — DULOXETINE HCL 30 MG PO CPEP: 30.0000 mg | ORAL_CAPSULE | Freq: Two times a day (BID) | ORAL | 2 refills | Status: DC

## 2017-12-08 NOTE — Telephone Encounter (Signed)
Pt came in office requesting a medication refill on her Cymbalta. Best phone # to contact is [phone].

## 2018-01-01 ENCOUNTER — Telehealth: Payer: Self-pay | Admitting: Family Medicine

## 2018-01-01 NOTE — Telephone Encounter (Signed)
Pt called and would like for her most recent lab work results mailed to her.

## 2018-01-01 NOTE — Telephone Encounter (Signed)
Labs printed and mailed to patient. Joenathan Sakuma,CMA

## 2018-01-12 ENCOUNTER — Other Ambulatory Visit: Payer: Self-pay | Admitting: Family Medicine

## 2018-02-14 ENCOUNTER — Other Ambulatory Visit: Payer: Self-pay | Admitting: Family Medicine

## 2018-03-12 ENCOUNTER — Other Ambulatory Visit: Payer: Self-pay | Admitting: Family Medicine

## 2018-03-25 ENCOUNTER — Telehealth: Payer: Self-pay | Admitting: *Deleted

## 2018-03-25 ENCOUNTER — Other Ambulatory Visit: Payer: Self-pay | Admitting: Family Medicine

## 2018-03-25 NOTE — Telephone Encounter (Signed)
Pt lm on nurse line. Asking for money to help with her medications.   Attemtpted to call back.  No answer and unable to lm. Yolanda Krueger, [name], CMA

## 2018-03-26 NOTE — Telephone Encounter (Signed)
Thank you for doing that, I am sure the patient appreciate.  Marjie Skiff, MD [place], PGY-3

## 2018-03-26 NOTE — Telephone Encounter (Signed)
Pt walked into office to check status.    [facility] and both medications (Vimpat and Cymbalta) will cost her $3 each.  Per patient she does not have the money at this time.   Spoke with Lanelle Bal they can waive the copay on the cymbalta but not the vimpat as it is controlled.   Asked that she waive the cymbalta fee and we would provide $3 for the vimpat.  Pt appreciative. Leoni Goodness, [name], CMA

## 2018-04-13 ENCOUNTER — Other Ambulatory Visit: Payer: Self-pay | Admitting: Family Medicine

## 2018-05-23 ENCOUNTER — Other Ambulatory Visit: Payer: Self-pay | Admitting: Family Medicine

## 2018-06-16 ENCOUNTER — Encounter: Payer: Self-pay | Admitting: Family Medicine

## 2018-06-16 ENCOUNTER — Other Ambulatory Visit: Payer: Self-pay

## 2018-06-16 ENCOUNTER — Ambulatory Visit (INDEPENDENT_AMBULATORY_CARE_PROVIDER_SITE_OTHER): Payer: Medicaid Other | Admitting: Family Medicine

## 2018-06-16 VITALS — BP 120/60 | HR 78 | Temp 98.4°F | Wt 146.4 lb

## 2018-06-16 DIAGNOSIS — R21 Rash and other nonspecific skin eruption: Secondary | ICD-10-CM | POA: Diagnosis not present

## 2018-06-16 MED ORDER — VIMPAT 100 MG PO TABS: ORAL_TABLET | ORAL | 1 refills | Status: DC

## 2018-06-16 MED ORDER — LEVETIRACETAM 1000 MG PO TABS: ORAL_TABLET | ORAL | 1 refills | Status: DC

## 2018-06-16 MED ORDER — HYDROCORTISONE 2.5 % EX OINT: TOPICAL_OINTMENT | Freq: Two times a day (BID) | CUTANEOUS | 0 refills | Status: DC

## 2018-06-16 MED ORDER — VALACYCLOVIR HCL 1 G PO TABS: 1000.0000 mg | ORAL_TABLET | Freq: Three times a day (TID) | ORAL | 0 refills | Status: AC

## 2018-06-16 NOTE — Assessment & Plan Note (Signed)
Patient presents with a rash C3-C4 dermatomal pattern on the right side of her neck.  Rash is nonspecific.  Given pruritus described with some burning associated and unilaterality there is some concern for possible shingles.  Rash does not appear to be fungal or bacterial  in nature given normal vitals and morphology of the rash.  Will treat with valacyclovir 1000 mg 3 times daily for 7 days for possible zoster early stage.  We will also prescribe hydrocortisone 2.5% ointment to help with pruritus.  Patient will follow-up if symptoms do not improve or worsen with above regimen.

## 2018-06-16 NOTE — Progress Notes (Signed)
   Subjective:    Patient ID: Yolanda Krueger, female    DOB: Aug 12, 1965, 53 y.o.   MRN: [id number]   CC: Medication refill and neck rash  HPI: Patient is a 53 year old female with a complex past medical history who presents today for refill of her medication and discuss rash on the right side of her neck.  Patient reports that she has missed her appointment with [facility] oncology in February, and she was unable to refill her Vimpat and Keppra that was prescribed to her last year after her brain tumor resection and radiation.  Patient reports that she recently ran out of her medication.  She is trying to reschedule her [facility] oncology appointment in the next few weeks.  Patient also reports this itchy rash on the right side of her neck has been there for a few days.  Patient reports that she has had a similar rash in the past and was prescribed a "cream".  She is otherwise doing well has no other acute complaint today.  She denies any headache, chest pain, shortness of breath, dizziness, vision change, weight loss, night sweats.  Smoking status reviewed   ROS: all other systems were reviewed and are negative other than in the HPI   Past Medical History:  Diagnosis Date  . Bipolar 1 disorder ([place])   . Cancer ([place])   . Hypothyroid   . Malignant melanoma, metastatic ([place])   . PTSD (post-traumatic stress disorder)     Past Surgical History:  Procedure Laterality Date  . MELANOMA EXCISION      Past medical history, surgical, family, and social history reviewed and updated in the EMR as appropriate.  Objective:  BP 120/60   Pulse 78   Temp 98.4 F (36.9 C) (Oral)   Wt 146 lb 6 oz (66.4 kg)   LMP  (LMP Unknown)   SpO2 98%   BMI 25.13 kg/m   Vitals and nursing note reviewed  General: NAD, pleasant, able to participate in exam Cardiac: RRR, normal heart sounds, no murmurs. 2+ radial and PT pulses bilaterally Respiratory: CTAB, normal effort, No wheezes, rales or rhonchi Abdomen: soft,  nontender, nondistended, no hepatic or splenomegaly, +BS Extremities: no edema or cyanosis. WWP. Skin: Nonspecific mild erythematous macular rash seen on the sternocleidomastoid on the right side of her neck.  No excoriation, discharge, blister or scales noted. Neuro: alert and oriented x4, no focal deficits Psych: Normal affect and mood   Assessment & Plan:   Rash and nonspecific skin eruption Patient presents with a rash C3-C4 dermatomal pattern on the right side of her neck.  Rash is nonspecific.  Given pruritus described with some burning associated and unilaterality there is some concern for possible shingles.  Rash does not appear to be fungal or bacterial  in nature given normal vitals and morphology of the rash.  Will treat with valacyclovir 1000 mg 3 times daily for 7 days for possible zoster early stage.  We will also prescribe hydrocortisone 2.5% ointment to help with pruritus.  Patient will follow-up if symptoms do not improve or worsen with above regimen.  Vimpat and Keppra medication have been refilled.  Patient will follow-up with [facility] oncology schedule appointment.  Marjie Skiff, MD [place] PGY-3

## 2018-07-01 ENCOUNTER — Other Ambulatory Visit: Payer: Self-pay | Admitting: *Deleted

## 2018-07-01 MED ORDER — DULOXETINE HCL 30 MG PO CPEP: 30.0000 mg | ORAL_CAPSULE | Freq: Two times a day (BID) | ORAL | 2 refills | Status: DC

## 2018-07-13 ENCOUNTER — Other Ambulatory Visit: Payer: Self-pay

## 2018-07-14 MED ORDER — QUETIAPINE FUMARATE 100 MG PO TABS: ORAL_TABLET | ORAL | 1 refills | Status: DC

## 2018-08-14 ENCOUNTER — Other Ambulatory Visit: Payer: Self-pay | Admitting: Family Medicine

## 2018-08-19 ENCOUNTER — Other Ambulatory Visit: Payer: Self-pay | Admitting: Family Medicine

## 2018-08-20 ENCOUNTER — Other Ambulatory Visit: Payer: Self-pay | Admitting: Family Medicine

## 2018-08-21 ENCOUNTER — Other Ambulatory Visit: Payer: Self-pay | Admitting: Family Medicine

## 2018-09-01 ENCOUNTER — Ambulatory Visit (INDEPENDENT_AMBULATORY_CARE_PROVIDER_SITE_OTHER): Payer: Medicaid Other | Admitting: Family Medicine

## 2018-09-01 ENCOUNTER — Encounter: Payer: Self-pay | Admitting: Family Medicine

## 2018-09-01 ENCOUNTER — Other Ambulatory Visit: Payer: Self-pay

## 2018-09-01 DIAGNOSIS — M542 Cervicalgia: Secondary | ICD-10-CM | POA: Diagnosis not present

## 2018-09-01 MED ORDER — CAPSAICIN 0.025 % EX CREA: TOPICAL_CREAM | Freq: Two times a day (BID) | CUTANEOUS | 1 refills | Status: DC

## 2018-09-01 NOTE — Progress Notes (Signed)
Subjective  Yolanda Krueger is a 53 y.o. female is presenting with the following  NECK PAIN Has pain in her Left side of neck and in left arm started several days ago slowly.  Her rash resolved after her visit in March.  She feels the area might be swollen but the pain is bothering her the most.  No problems swallowing or breathing No rash.  Has residual changes in her neck and left shoulder (limited range of motion) from prior cancer surgery.  Has been using HC cream on neck but is not helping  Chief Complaint noted Review of Symptoms - see HPI PMH - Smoking status noted.    Objective Vital Signs reviewed BP 118/62   Pulse 84   LMP  (LMP Unknown)   SpO2 99%  Alert nad Points on L lateral neck and upper shoulder  Post surgical changes - see picture No palpable focal soft tissue swelling or redness  Patient winces with light touch - hypersensitivity Has decreased ROM of left shoulder that patient reports as old Distal strength and sensation intact in left hand   Assessments/Plans  See after visit summary for details of patient instuctions  Neck pain Patient complaining of left sided neck pain and subjective mild swelling that I do not appreciate. She is hypersensitive to light touch and was treated recently for a potential zoster rash on that side.  Possibly has post herpetic neuralgia.   Will treat with zostrix cream since already on Lyrica.  She has a follow up appointment with her oncologist in July

## 2018-09-01 NOTE — Patient Instructions (Addendum)
Good to see you today!  Thanks for coming in.  I think you may Post Herpetic Neuralgia - Pain after Shingles  Continue to take the Lyrica regularly Add Zostrix (Capsacin cream ) twice a day rub gently into the painful area.  Do not get in your eyes   If its is getting worse - trouble swallowing or fever then come back  Keep your appointment in July with the cancer physician

## 2018-09-02 DIAGNOSIS — M542 Cervicalgia: Secondary | ICD-10-CM | POA: Insufficient documentation

## 2018-09-02 NOTE — Assessment & Plan Note (Signed)
Patient complaining of left sided neck pain and subjective mild swelling that I do not appreciate. She is hypersensitive to light touch and was treated recently for a potential zoster rash on that side.  Possibly has post herpetic neuralgia.   Will treat with zostrix cream since already on Lyrica.  She has a follow up appointment with her oncologist in July

## 2018-10-02 ENCOUNTER — Other Ambulatory Visit: Payer: Self-pay | Admitting: Family Medicine

## 2018-11-03 ENCOUNTER — Other Ambulatory Visit: Payer: Self-pay

## 2018-11-03 MED ORDER — LEVETIRACETAM 1000 MG PO TABS: ORAL_TABLET | ORAL | 1 refills | Status: DC

## 2018-11-03 MED ORDER — CYCLOBENZAPRINE HCL 5 MG PO TABS: ORAL_TABLET | ORAL | 0 refills | Status: DC

## 2018-11-08 ENCOUNTER — Other Ambulatory Visit: Payer: Self-pay

## 2018-11-08 DIAGNOSIS — R21 Rash and other nonspecific skin eruption: Secondary | ICD-10-CM

## 2018-11-08 MED ORDER — HYDROCORTISONE 2.5 % EX OINT: TOPICAL_OINTMENT | Freq: Two times a day (BID) | CUTANEOUS | 0 refills | Status: DC

## 2018-11-08 NOTE — Telephone Encounter (Signed)
Please call and inform pt that she should be following with oncology for refills on Vimpat and Keppra.  We did refill her medication previously when she was not able to see her oncologist but have not been monitoring those medications.  Please ask why pt needs a refill of Lyrica.  This was initially given for shingles.  Does she have a recurrence?   Thank you, Matilde Haymaker, MD

## 2018-11-10 NOTE — Telephone Encounter (Signed)
Pt calls back:  She states that she has an appt with her oncologist on 12/09/18 and is requesting a refill until then of the vimpat.  She states that she takes the lyrica for her fibromyalgia and shoulder pain at night.   To MD to advise. Christen Bame, CMA

## 2018-11-10 NOTE — Telephone Encounter (Signed)
Pt lm on nurse line requesting a refill on lyrica and vimpat.  Will forward to blue team to relay below message.  Christen Bame, CMA

## 2018-11-12 MED ORDER — PREGABALIN 75 MG PO CAPS: 75.0000 mg | ORAL_CAPSULE | Freq: Three times a day (TID) | ORAL | 0 refills | Status: DC

## 2018-11-12 NOTE — Addendum Note (Signed)
Addended by: Grant Ruts on: 11/12/2018 04:49 AM   Modules accepted: Orders

## 2018-11-12 NOTE — Telephone Encounter (Signed)
LMOVM of boyfriend Newt Lukes).  Christen Bame, CMA

## 2018-11-12 NOTE — Telephone Encounter (Signed)
Please Let Yolanda Krueger know that I have refilled her Lyrica.  I did not refill her Vimpat.  This is not a medication that I am familiar with and I do not monitor.  It sounds like she is in contact with the physician who has been prescribing it.  That physician should be refilling Vimpat for her.   Thank you, Matilde Haymaker, MD

## 2018-11-23 ENCOUNTER — Other Ambulatory Visit: Payer: Self-pay

## 2018-11-25 NOTE — Telephone Encounter (Signed)
2nd request from pharmacy. Almedia Cordell, CMA  

## 2018-11-29 MED ORDER — LEVOTHYROXINE SODIUM 150 MCG PO TABS: 150.0000 ug | ORAL_TABLET | Freq: Every day | ORAL | 0 refills | Status: DC

## 2018-11-29 NOTE — Telephone Encounter (Signed)
Attempted to call pt without success. VM not set up.    According to her testing, she does not have hypothyroidism.  I would like her to be retested to assess her current level of TSH and T4. Can we please schedule her for an appointment in the next month.  I will provide a one month refill for her medication.  Matilde Haymaker, MD

## 2018-11-29 NOTE — Telephone Encounter (Signed)
Pt calls to check status.  She is "starting to have issues with my throat" because she took the last dose on Friday.  PCP text paged. Christen Bame, CMA

## 2018-11-30 NOTE — Telephone Encounter (Signed)
Attempted to reach pt. LVM  For pt to call office to set up appt to be seen by Dr. Pilar Plate. Salvatore Marvel, CMA

## 2018-12-09 DIAGNOSIS — C792 Secondary malignant neoplasm of skin: Secondary | ICD-10-CM | POA: Diagnosis not present

## 2018-12-09 DIAGNOSIS — D496 Neoplasm of unspecified behavior of brain: Secondary | ICD-10-CM | POA: Diagnosis not present

## 2018-12-09 DIAGNOSIS — E278 Other specified disorders of adrenal gland: Secondary | ICD-10-CM | POA: Diagnosis not present

## 2018-12-09 DIAGNOSIS — F419 Anxiety disorder, unspecified: Secondary | ICD-10-CM | POA: Diagnosis not present

## 2018-12-09 DIAGNOSIS — R918 Other nonspecific abnormal finding of lung field: Secondary | ICD-10-CM | POA: Diagnosis not present

## 2018-12-09 DIAGNOSIS — J984 Other disorders of lung: Secondary | ICD-10-CM | POA: Diagnosis not present

## 2018-12-09 DIAGNOSIS — G40909 Epilepsy, unspecified, not intractable, without status epilepticus: Secondary | ICD-10-CM | POA: Diagnosis not present

## 2018-12-09 DIAGNOSIS — C7800 Secondary malignant neoplasm of unspecified lung: Secondary | ICD-10-CM | POA: Diagnosis not present

## 2018-12-09 DIAGNOSIS — K760 Fatty (change of) liver, not elsewhere classified: Secondary | ICD-10-CM | POA: Diagnosis not present

## 2018-12-09 DIAGNOSIS — D3500 Benign neoplasm of unspecified adrenal gland: Secondary | ICD-10-CM | POA: Diagnosis not present

## 2018-12-09 DIAGNOSIS — Z923 Personal history of irradiation: Secondary | ICD-10-CM | POA: Diagnosis not present

## 2018-12-09 DIAGNOSIS — S0292XA Unspecified fracture of facial bones, initial encounter for closed fracture: Secondary | ICD-10-CM | POA: Diagnosis not present

## 2018-12-09 DIAGNOSIS — C7931 Secondary malignant neoplasm of brain: Secondary | ICD-10-CM | POA: Diagnosis not present

## 2018-12-09 DIAGNOSIS — R410 Disorientation, unspecified: Secondary | ICD-10-CM | POA: Diagnosis not present

## 2018-12-09 DIAGNOSIS — R55 Syncope and collapse: Secondary | ICD-10-CM | POA: Diagnosis not present

## 2018-12-09 DIAGNOSIS — C439 Malignant melanoma of skin, unspecified: Secondary | ICD-10-CM | POA: Diagnosis not present

## 2018-12-09 DIAGNOSIS — K838 Other specified diseases of biliary tract: Secondary | ICD-10-CM | POA: Diagnosis not present

## 2018-12-09 DIAGNOSIS — M542 Cervicalgia: Secondary | ICD-10-CM | POA: Diagnosis not present

## 2018-12-09 DIAGNOSIS — G893 Neoplasm related pain (acute) (chronic): Secondary | ICD-10-CM | POA: Diagnosis not present

## 2018-12-14 ENCOUNTER — Other Ambulatory Visit: Payer: Self-pay

## 2018-12-20 MED ORDER — QUETIAPINE FUMARATE 100 MG PO TABS: ORAL_TABLET | ORAL | 1 refills | Status: DC

## 2018-12-24 ENCOUNTER — Other Ambulatory Visit: Payer: Self-pay

## 2018-12-24 ENCOUNTER — Ambulatory Visit (INDEPENDENT_AMBULATORY_CARE_PROVIDER_SITE_OTHER): Payer: Medicaid Other | Admitting: Family Medicine

## 2018-12-24 ENCOUNTER — Encounter: Payer: Self-pay | Admitting: Family Medicine

## 2018-12-24 VITALS — BP 118/76 | HR 86 | Wt 150.0 lb

## 2018-12-24 DIAGNOSIS — Z23 Encounter for immunization: Secondary | ICD-10-CM | POA: Diagnosis not present

## 2018-12-24 DIAGNOSIS — E039 Hypothyroidism, unspecified: Secondary | ICD-10-CM

## 2018-12-24 DIAGNOSIS — C799 Secondary malignant neoplasm of unspecified site: Secondary | ICD-10-CM

## 2018-12-24 DIAGNOSIS — F17200 Nicotine dependence, unspecified, uncomplicated: Secondary | ICD-10-CM | POA: Diagnosis not present

## 2018-12-24 DIAGNOSIS — C439 Malignant melanoma of skin, unspecified: Secondary | ICD-10-CM

## 2018-12-24 DIAGNOSIS — G8929 Other chronic pain: Secondary | ICD-10-CM | POA: Diagnosis not present

## 2018-12-24 DIAGNOSIS — K089 Disorder of teeth and supporting structures, unspecified: Secondary | ICD-10-CM

## 2018-12-24 DIAGNOSIS — F319 Bipolar disorder, unspecified: Secondary | ICD-10-CM | POA: Diagnosis not present

## 2018-12-24 NOTE — Progress Notes (Signed)
Subjective:  Yolanda Krueger is a 53 y.o. female who presents to the [facility] today with a chief complaint of swollen lip.   HPI:  Cancer Yolanda Krueger has a previous medical history of metastatic melanoma with metastases to the brain.  Since her diagnosis, she is undergone surgery, radiation and chemotherapy.  She reports that her last surgery 06/2017.  Her last treatment of radiation was 07/2017.  During our conversation she also noted that she had a history of breast cancer and she is now status post mastectomy with breast reconstruction.  Dental pain, chronic She notes that she has been having some dental pain for several years.  Seems to stretch back to her neck surgery several years ago.  She primarily has pain on the left side of her jaw which is tender to the touch.  She reports that this pain does radiate down her neck at times.  She has not noticed any recent swelling or drainage.  This seems to be a chronic issue.  She reports that she has not seen a dentist in several years because she had trouble finding one with her Medicaid insurance.  Hypothyroid She has a history of hypothyroidism.  She is noting that her energy ebbs and flows she has moments of high energy during the day followed by significant fatigue in the morning and evening.  She has mild constipation which is easily controlled with MiraLAX.  She wanted to make sure that her thyroid hormone is appropriate.  She reports some trouble concentrating.  Bipolar 1 She is previously seen [place] health [place] is not currently seeing anyone who is prescribing trazodone or Seroquel.  Nicotine use disorder She has a long history of smoking.  She has been encouraged to quit smoking multiple times and has attempted in the past.  She currently uses both a nicotine patch nicotine gum for nicotine replacement and she continues to occasionally smoke.     Chief Complaint noted Review of Symptoms - see HPI PMH -  Smoking status noted.    Objective:  Physical Exam: BP 118/76   Pulse 86   Wt 150 lb (68 kg)   LMP  (LMP Unknown)   SpO2 99%   BMI 25.75 kg/m    Gen: NAD, resting comfortably HEENT: Moderate pain with palpation of the left jaw.  Very poor dentition with many missing teeth.  No evidence of erythema or purulence on exam.  Palpation of the neck revealed sclerotic tissue on the left neck surrounding an area of previous surgical intervention and radiation therapy. CV: RRR with no murmurs appreciated Pulm: NWOB, CTAB with no crackles, wheezes, or rhonchi GI: Normal bowel sounds present. Soft, Nontender, Nondistended. MSK: no edema, cyanosis, or clubbing noted Skin: warm, dry Neuro: grossly normal, moves all extremities Psych: Normal affect and thought content  No results found for this or any previous visit (from the past 72 hour(s)).   Assessment/Plan:  Metastatic melanoma (Yolanda Krueger) She reports she is currently in remission.  Her last last intervention by oncology was performed over a year ago.  She now follows with oncology on an annual basis.  She continues to take naproxen for headaches.  During her visit, she noted to history of breast cancer with bilateral mastectomy and breast reconstruction.  This was not seen in her recent office notes.  Clarify history of breast cancer at next visit. -Continue to follow with oncology as needed -Continue naproxen  Hypothyroid -Follow-up TSH  Bipolar 1  disorder (Yolanda Krueger) No current behavioral health provider.  Encouraged to establish care at [facility].  No labs drawn in the past year. -Follow-up lipid panel, CMP, CBC  Nicotine use disorder Continue to assess interest in quitting.  Provide resources as appropriate.  Not interested in quitting today. -Continue nicotine patch/gum  Chronic dental pain No acute process identified.  This appears to be chronic pain lasting for several years.  She is encouraged to follow-up with a dentist to address her  poor dentition and dental pain. -List of Medicaid appropriate dentists provided   Need premedication consider flu -Flu vaccine given today  Follow-up in 1 month to assess concerns cannot be addressed today and further address health maintenance.

## 2018-12-24 NOTE — Assessment & Plan Note (Signed)
Follow up TSH

## 2018-12-24 NOTE — Assessment & Plan Note (Addendum)
She reports she is currently in remission.  Her last last intervention by oncology was performed over a year ago.  She now follows with oncology on an annual basis.  She continues to take naproxen for headaches.  During her visit, she noted to history of breast cancer with bilateral mastectomy and breast reconstruction.  This was not seen in her recent office notes.  Clarify history of breast cancer at next visit. -Continue to follow with oncology as needed -Continue naproxen

## 2018-12-24 NOTE — Addendum Note (Signed)
Addended by: Grant Ruts on: 12/24/2018 11:10 AM   Modules accepted: Orders

## 2018-12-24 NOTE — Assessment & Plan Note (Signed)
No current behavioral health provider.  Encouraged to establish care at [facility].  No labs drawn in the past year. -Follow-up lipid panel, CMP, CBC

## 2018-12-24 NOTE — Assessment & Plan Note (Signed)
Continue to assess interest in quitting.  Provide resources as appropriate.  Not interested in quitting today. -Continue nicotine patch/gum

## 2018-12-24 NOTE — Patient Instructions (Signed)
I know we were only able to cover a couple of topics today.  Here's a quick review of what we covered:  Cancer:  I am glad to hear you are doing well.  Remember to follow up with you oncologist to continue receiving appropriate screening and care.  Thyroid:  We will check you levels today.  I will call you with the results.  Jaw pain/dental issues:  You have the sheet with medicaid appropriate dentists.  Please follow up with one of them to help address your dental health and jaw pain.  Please come back in one month to address the things we were not able to cover today.

## 2018-12-24 NOTE — Assessment & Plan Note (Signed)
No acute process identified.  This appears to be chronic pain lasting for several years.  She is encouraged to follow-up with a dentist to address her poor dentition and dental pain. -List of Medicaid appropriate dentists provided

## 2018-12-25 LAB — COMPREHENSIVE METABOLIC PANEL
ALT: 20 IU/L (ref 0–32)
AST: 26 IU/L (ref 0–40)
Albumin/Globulin Ratio: 2.2 (ref 1.2–2.2)
Albumin: 4.6 g/dL (ref 3.8–4.9)
Alkaline Phosphatase: 134 IU/L — ABNORMAL HIGH (ref 39–117)
BUN/Creatinine Ratio: 23 (ref 9–23)
BUN: 18 mg/dL (ref 6–24)
Bilirubin Total: 0.3 mg/dL (ref 0.0–1.2)
CO2: 26 mmol/L (ref 20–29)
Calcium: 10 mg/dL (ref 8.7–10.2)
Chloride: 102 mmol/L (ref 96–106)
Creatinine, Ser: 0.8 mg/dL (ref 0.57–1.00)
GFR calc Af Amer: 97 mL/min/{1.73_m2} (ref >59)
GFR calc non Af Amer: 84 mL/min/{1.73_m2} (ref >59)
Globulin, Total: 2.1 g/dL (ref 1.5–4.5)
Glucose: 81 mg/dL (ref 65–99)
Potassium: 4.7 mmol/L (ref 3.5–5.2)
Sodium: 140 mmol/L (ref 134–144)
Total Protein: 6.7 g/dL (ref 6.0–8.5)

## 2018-12-25 LAB — CBC
Hematocrit: 38.1 % (ref 34.0–46.6)
Hemoglobin: 12.7 g/dL (ref 11.1–15.9)
MCH: 30.6 pg (ref 26.6–33.0)
MCHC: 33.3 g/dL (ref 31.5–35.7)
MCV: 92 fL (ref 79–97)
Platelets: 224 10*3/uL (ref 150–450)
RBC: 4.15 x10E6/uL (ref 3.77–5.28)
RDW: 12.4 % (ref 11.7–15.4)
WBC: 4.4 10*3/uL (ref 3.4–10.8)

## 2018-12-25 LAB — LIPID PANEL
Chol/HDL Ratio: 2.8 ratio (ref 0.0–4.4)
Cholesterol, Total: 240 mg/dL — ABNORMAL HIGH (ref 100–199)
HDL: 86 mg/dL (ref >39)
LDL Chol Calc (NIH): 142 mg/dL — ABNORMAL HIGH (ref 0–99)
Triglycerides: 73 mg/dL (ref 0–149)
VLDL Cholesterol Cal: 12 mg/dL (ref 5–40)

## 2018-12-25 LAB — TSH: TSH: 0.006 u[IU]/mL — ABNORMAL LOW (ref 0.450–4.500)

## 2019-01-02 ENCOUNTER — Other Ambulatory Visit: Payer: Self-pay | Admitting: Family Medicine

## 2019-01-06 ENCOUNTER — Other Ambulatory Visit: Payer: Self-pay | Admitting: *Deleted

## 2019-01-06 MED ORDER — DULOXETINE HCL 30 MG PO CPEP: 30.0000 mg | ORAL_CAPSULE | Freq: Two times a day (BID) | ORAL | 2 refills | Status: DC

## 2019-01-08 DIAGNOSIS — Z3009 Encounter for other general counseling and advice on contraception: Secondary | ICD-10-CM | POA: Diagnosis not present

## 2019-01-08 DIAGNOSIS — Z0389 Encounter for observation for other suspected diseases and conditions ruled out: Secondary | ICD-10-CM | POA: Diagnosis not present

## 2019-01-08 DIAGNOSIS — Z1388 Encounter for screening for disorder due to exposure to contaminants: Secondary | ICD-10-CM | POA: Diagnosis not present

## 2019-01-25 ENCOUNTER — Ambulatory Visit: Payer: Medicaid Other | Admitting: Family Medicine

## 2019-02-25 ENCOUNTER — Ambulatory Visit (INDEPENDENT_AMBULATORY_CARE_PROVIDER_SITE_OTHER): Payer: Medicaid Other | Admitting: Family Medicine

## 2019-02-25 ENCOUNTER — Encounter: Payer: Self-pay | Admitting: Family Medicine

## 2019-02-25 ENCOUNTER — Other Ambulatory Visit: Payer: Self-pay

## 2019-02-25 VITALS — BP 110/64 | HR 89 | Wt 154.0 lb

## 2019-02-25 DIAGNOSIS — F319 Bipolar disorder, unspecified: Secondary | ICD-10-CM

## 2019-02-25 DIAGNOSIS — F339 Major depressive disorder, recurrent, unspecified: Secondary | ICD-10-CM | POA: Diagnosis not present

## 2019-02-25 DIAGNOSIS — K089 Disorder of teeth and supporting structures, unspecified: Secondary | ICD-10-CM

## 2019-02-25 DIAGNOSIS — F17200 Nicotine dependence, unspecified, uncomplicated: Secondary | ICD-10-CM

## 2019-02-25 DIAGNOSIS — R21 Rash and other nonspecific skin eruption: Secondary | ICD-10-CM | POA: Diagnosis not present

## 2019-02-25 DIAGNOSIS — G8929 Other chronic pain: Secondary | ICD-10-CM | POA: Diagnosis not present

## 2019-02-25 DIAGNOSIS — E039 Hypothyroidism, unspecified: Secondary | ICD-10-CM

## 2019-02-25 DIAGNOSIS — F329 Major depressive disorder, single episode, unspecified: Secondary | ICD-10-CM | POA: Insufficient documentation

## 2019-02-25 MED ORDER — DIPHENHYDRAMINE HCL 2 % EX CREA: TOPICAL_CREAM | Freq: Three times a day (TID) | CUTANEOUS | 0 refills | Status: DC | PRN

## 2019-02-25 MED ORDER — HYDROCORTISONE 2.5 % EX OINT: TOPICAL_OINTMENT | Freq: Two times a day (BID) | CUTANEOUS | 0 refills | Status: DC

## 2019-02-25 MED ORDER — BUPROPION HCL ER (XL) 150 MG PO TB24: 150.0000 mg | ORAL_TABLET | Freq: Every day | ORAL | 2 refills | Status: DC

## 2019-02-25 NOTE — Assessment & Plan Note (Signed)
She was encouraged to verify whether or not the provider she plans to see at [facility] can provide psychiatric medications.  If not, she was instructed to find care at [facility] for appropriate medication monitoring.  This would also be helpful for her continued monitoring of her current depressive episode.

## 2019-02-25 NOTE — Progress Notes (Signed)
Subjective:  Yolanda Krueger is a 53 y.o. female who presents to the [facility] today for a follow-up visit for bipolar disorder.   HPI: Bipolar disorder Her current medications include Seroquel and trazodone.  She was advised at last visit to follow-up with a psychiatric provider who could prescribe these medications.  She has gotten connected with a provider at [facility].  She is not sure if this is a psychiatrist or psychologist.  Major depressive disorder She scored 14 on her PHQ-9 today.  She reports that she has been feeling particularly down due to big changes in her life from coronavirus.  This is been difficult for her to stay quarantined at home with her significant other.  Her relationship with her boyfriend is good but does seem to change slightly because they not spend much more time together at home than before.  Current symptoms include having decreased interest in activities to generally bring her pleasure, difficulty staying asleep, feeling low energy, decreased appetite, difficulty concentrating and speaking/thinking slowly so that the people of noticed.  She denies SI/HI.  She has previously been treated for major depression with Wellbutrin and responded well.  Tooth pain She has ongoing tooth/mouth pain which we have discussed at previous visit.  She was recommended she a dentist which she has not yet done.  There are no significant changes in her treatment at this time.  Neck itching She has been having ongoing itching for many months on the left side of her neck.  She reports this itching can be quite intolerable at times.  She has not noticed any significant rash popping up.  No bumps noted.  She was previously treated with a steroid cream.  Hypothyroidism TSH taken at last visit was noted to be low.  She has noted occasional symptoms of increased heart rate in the past month so they do not seem to bother her very much.  She has also noted difficulty staying asleep (as  noted under her depressive symptoms).  No diarrhea noted.  Chief Complaint noted Review of Symptoms - see HPI PMH -continues to smoke.  Not interested in quitting today.  Objective:  Physical Exam: BP 110/64   Pulse 89   Wt 154 lb (69.9 kg)   LMP  (LMP Unknown)   SpO2 98%   BMI 26.43 kg/m    Gen: NAD, resting comfortably resting comfortably in her exam chair. HEENT: Significant sclerosis and scarring of her skin Her left anterior neck.  Moderately painful to palpation (baseline pain for this area).  Telangiectasias noted is a large patch about 5 cm x 3 cm on her left occiput.  Mild excoriations noted around the nape of neck.  No pustules or papules.  No erythema noted no obvious skin thickening.  No enlarged thyroid gland. CV: RRR with no murmurs appreciated Psych: Normal affect and thought content  No results found for this or any previous visit (from the past 72 hour(s)).   Assessment/Plan:  Hypothyroid Low TSH likely secondary to medication.  She recently decreased her dose of Synthroid earlier this month.  Plan to draw T3/T4 levels today and recheck her TSH in 4 weeks. -Follow-up T3/T4 -Continue levothyroxine at new dose (125 mcg daily) -Follow-up TSH in 4 weeks  Nicotine use disorder Not interested in quitting today.  We will start Wellbutrin for above-noted depressive episode.  We will continue to monitor readiness for cessation or cutting back.  Chronic dental pain Provided list of dentists.  Bipolar 1 disorder (  [place]) She was encouraged to verify whether or not the provider she plans to see at [facility] can provide psychiatric medications.  If not, she was instructed to find care at [facility] for appropriate medication monitoring.  This would also be helpful for her continued monitoring of her current depressive episode.  Major depressive disorder Has previously struggled with major depression which was responsive to Wellbutrin.  Currently denying SI/HI. -Start Wellbutrin  -Provided a list of Medicaid accepting therapist -Follow-up in 1 month

## 2019-02-25 NOTE — Assessment & Plan Note (Signed)
Not interested in quitting today.  We will start Wellbutrin for above-noted depressive episode.  We will continue to monitor readiness for cessation or cutting back.

## 2019-02-25 NOTE — Assessment & Plan Note (Signed)
Has previously struggled with major depression which was responsive to Wellbutrin.  Currently denying SI/HI. -Start Wellbutrin -Provided a list of Medicaid accepting therapist -Follow-up in 1 month

## 2019-02-25 NOTE — Assessment & Plan Note (Signed)
Provided list of dentists.

## 2019-02-25 NOTE — Assessment & Plan Note (Signed)
Low TSH likely secondary to medication.  She recently decreased her dose of Synthroid earlier this month.  Plan to draw T3/T4 levels today and recheck her TSH in 4 weeks. -Follow-up T3/T4 -Continue levothyroxine at new dose (125 mcg daily) -Follow-up TSH in 4 weeks

## 2019-02-25 NOTE — Patient Instructions (Addendum)
Psychiatry: If you have another doctor who can prescribe trazadone and seroquel, please let me know who that is.  If you don't have another option.  Please establish care with [facility].  Tooth pain: Please contact one of the dentists on the list provided for your dental pain.  Neck itching: Please try to use the topical Benadryl cream for the itching. If it works well, I would prefer that you used it instead of the steroid.  Thyroid: Now that you're on a lower dose of your thyroid medication, I want to recheck your thyroid in the middle of December. We will also get some blood work today.

## 2019-02-26 LAB — T3, FREE: T3, Free: 3.4 pg/mL (ref 2.0–4.4)

## 2019-02-26 LAB — T4, FREE: Free T4: 1.78 ng/dL — ABNORMAL HIGH (ref 0.82–1.77)

## 2019-02-28 ENCOUNTER — Encounter: Payer: Self-pay | Admitting: Family Medicine

## 2019-03-14 DIAGNOSIS — G40909 Epilepsy, unspecified, not intractable, without status epilepticus: Secondary | ICD-10-CM | POA: Diagnosis not present

## 2019-03-14 DIAGNOSIS — G40919 Epilepsy, unspecified, intractable, without status epilepticus: Secondary | ICD-10-CM | POA: Diagnosis not present

## 2019-03-21 ENCOUNTER — Other Ambulatory Visit: Payer: Self-pay | Admitting: Family Medicine

## 2019-03-22 DIAGNOSIS — C7800 Secondary malignant neoplasm of unspecified lung: Secondary | ICD-10-CM | POA: Diagnosis not present

## 2019-03-22 DIAGNOSIS — G893 Neoplasm related pain (acute) (chronic): Secondary | ICD-10-CM | POA: Diagnosis not present

## 2019-03-22 DIAGNOSIS — F319 Bipolar disorder, unspecified: Secondary | ICD-10-CM | POA: Diagnosis not present

## 2019-04-13 ENCOUNTER — Other Ambulatory Visit: Payer: Medicaid Other

## 2019-04-13 ENCOUNTER — Other Ambulatory Visit: Payer: Self-pay | Admitting: Family Medicine

## 2019-04-13 DIAGNOSIS — E039 Hypothyroidism, unspecified: Secondary | ICD-10-CM

## 2019-04-27 ENCOUNTER — Other Ambulatory Visit: Payer: Medicaid Other

## 2019-05-02 ENCOUNTER — Other Ambulatory Visit: Payer: Medicaid Other

## 2019-05-02 ENCOUNTER — Other Ambulatory Visit: Payer: Self-pay

## 2019-05-02 DIAGNOSIS — E039 Hypothyroidism, unspecified: Secondary | ICD-10-CM

## 2019-05-03 ENCOUNTER — Other Ambulatory Visit: Payer: Self-pay | Admitting: Family Medicine

## 2019-05-03 LAB — TSH: TSH: 0.114 u[IU]/mL — ABNORMAL LOW (ref 0.450–4.500)

## 2019-05-03 MED ORDER — LEVOTHYROXINE SODIUM 112 MCG PO TABS: 112.0000 ug | ORAL_TABLET | ORAL | 3 refills | Status: DC

## 2019-05-12 ENCOUNTER — Encounter: Payer: Self-pay | Admitting: Family Medicine

## 2019-05-16 ENCOUNTER — Other Ambulatory Visit: Payer: Self-pay | Admitting: Family Medicine

## 2019-06-28 DIAGNOSIS — F319 Bipolar disorder, unspecified: Secondary | ICD-10-CM | POA: Diagnosis not present

## 2019-06-28 DIAGNOSIS — C7931 Secondary malignant neoplasm of brain: Secondary | ICD-10-CM | POA: Diagnosis not present

## 2019-06-28 DIAGNOSIS — C7800 Secondary malignant neoplasm of unspecified lung: Secondary | ICD-10-CM | POA: Diagnosis not present

## 2019-06-28 DIAGNOSIS — G893 Neoplasm related pain (acute) (chronic): Secondary | ICD-10-CM | POA: Diagnosis not present

## 2019-06-28 DIAGNOSIS — G40909 Epilepsy, unspecified, not intractable, without status epilepticus: Secondary | ICD-10-CM | POA: Diagnosis not present

## 2019-06-28 DIAGNOSIS — C792 Secondary malignant neoplasm of skin: Secondary | ICD-10-CM | POA: Diagnosis not present

## 2019-06-28 DIAGNOSIS — G4700 Insomnia, unspecified: Secondary | ICD-10-CM | POA: Diagnosis not present

## 2019-06-28 DIAGNOSIS — Z85820 Personal history of malignant melanoma of skin: Secondary | ICD-10-CM | POA: Diagnosis not present

## 2019-06-28 DIAGNOSIS — M542 Cervicalgia: Secondary | ICD-10-CM | POA: Diagnosis not present

## 2019-06-28 DIAGNOSIS — Z923 Personal history of irradiation: Secondary | ICD-10-CM | POA: Diagnosis not present

## 2019-06-28 DIAGNOSIS — Z801 Family history of malignant neoplasm of trachea, bronchus and lung: Secondary | ICD-10-CM | POA: Diagnosis not present

## 2019-06-28 DIAGNOSIS — Z79899 Other long term (current) drug therapy: Secondary | ICD-10-CM | POA: Diagnosis not present

## 2019-06-28 DIAGNOSIS — Z08 Encounter for follow-up examination after completed treatment for malignant neoplasm: Secondary | ICD-10-CM | POA: Diagnosis not present

## 2019-07-09 ENCOUNTER — Other Ambulatory Visit: Payer: Self-pay | Admitting: Family Medicine

## 2019-08-01 DIAGNOSIS — G40919 Epilepsy, unspecified, intractable, without status epilepticus: Secondary | ICD-10-CM | POA: Diagnosis not present

## 2019-08-01 DIAGNOSIS — R202 Paresthesia of skin: Secondary | ICD-10-CM | POA: Diagnosis not present

## 2019-08-01 DIAGNOSIS — R200 Anesthesia of skin: Secondary | ICD-10-CM | POA: Diagnosis not present

## 2019-08-06 IMAGING — MR MR HEAD WO/W CM
13 series · 48 of 48 positions shown · IV contrast (multihance)
Comparison: Noncontrast brain MRI 03/05/2016.  Head CTs 03/04/2016

CLINICAL DATA: 51-year-old female with metastatic melanoma. Left
eye drooping for the last several weeks.

EXAM:
MRI HEAD WITHOUT AND WITH CONTRAST
TECHNIQUE: Multiplanar, multiecho pulse sequences of the brain and surrounding
structures were obtained without and with intravenous contrast.
CONTRAST:  13mL MULTIHANCE GADOBENATE DIMEGLUMINE 529 MG/ML IV SOLN

[Series 5: T1 · sagittal · 4.0mm · 0.75mm/px · 1 of 31 slices shown (1 of 3)]
[im 1/31]
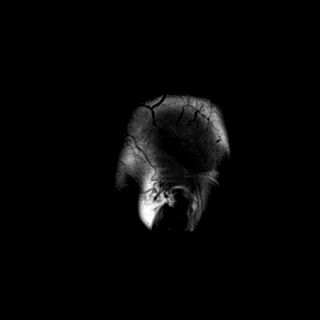

[Series 6: DWI · axial · 3.0mm · 1.44mm/px · z∈[-74,+68]mm · 4 of 88 slices shown (1 of 4)]
[im 1/88]
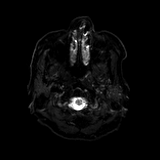
[im 30/88]
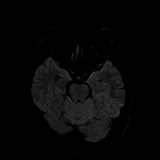
[im 59/88]
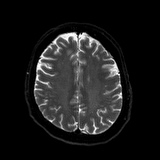
[im 88/88]
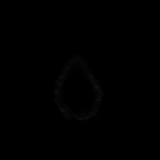

[Series 7: DWI · axial · 3.0mm · 1.44mm/px · z∈[-74,+68]mm · 3 of 44 slices shown (2 of 4)]
[im 1/44]
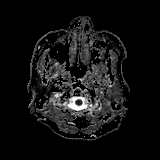
[im 22/44]
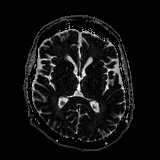
[im 44/44]
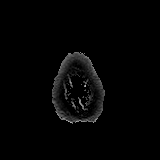

[Series 8: DWI · coronal · 5.0mm · 1.44mm/px · 4 of 60 slices shown (3 of 4)]
[im 1/60]
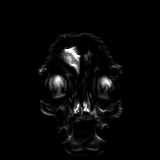
[im 20/60]
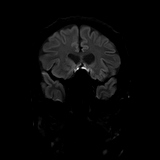
[im 40/60]
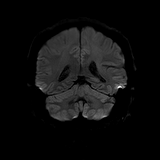
[im 60/60]
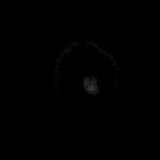

[Series 9: DWI · coronal · 5.0mm · 1.44mm/px · 2 of 30 slices shown (4 of 4)]
[im 1/30]
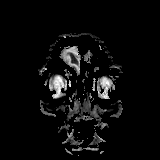
[im 30/30]
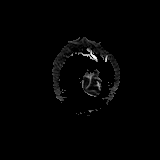

[Series 10: T2 · axial · 4.0mm · 0.36mm/px · z∈[-77,+68]mm · 2 of 29 slices shown]
[im 1/29]
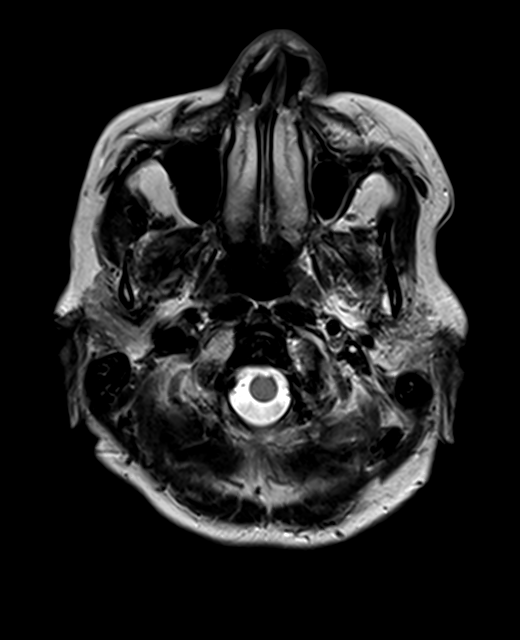
[im 29/29]
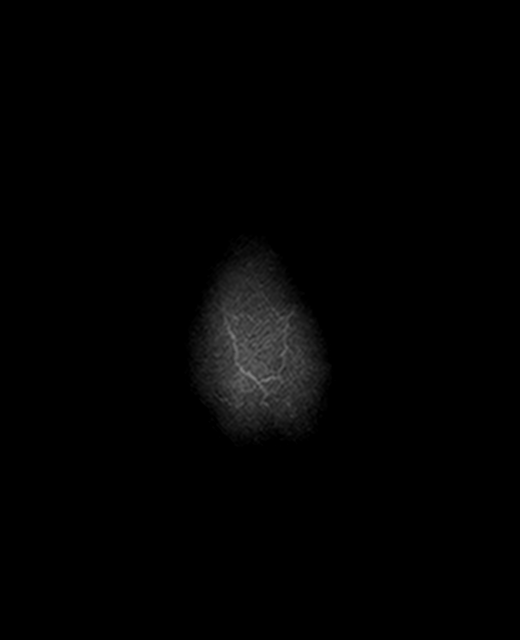

[Series 11: FLAIR · axial · 3.0mm · 0.72mm/px · z∈[-80,+70]mm · 2 of 26 slices shown]
[im 1/26]
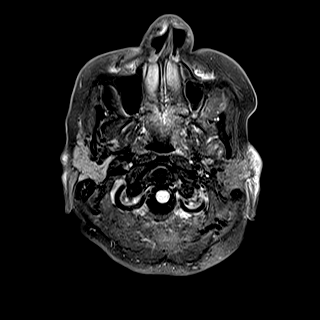
[im 26/26]
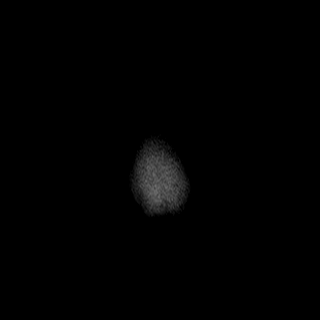

[Series 13: swi_images · axial · 1.5mm · 0.90mm/px · z∈[-76,+67]mm · 6 of 96 slices shown]
[im 1/96]
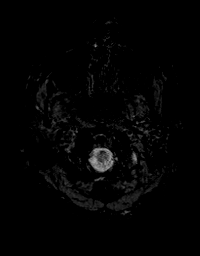
[im 20/96]
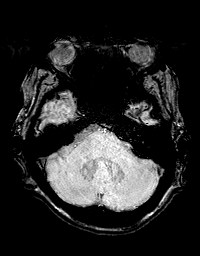
[im 39/96]
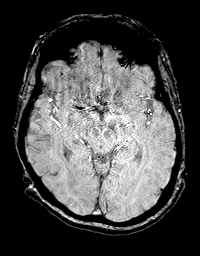
[im 58/96]
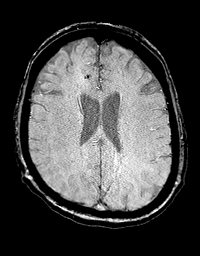
[im 77/96]
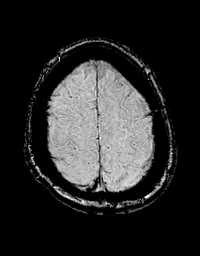
[im 96/96]
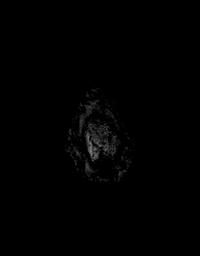

[Series 14: T1 · axial · 1.0mm · 0.90mm/px · z∈[-76,+67]mm · 9 of 144 slices shown (2 of 3)]
[im 1/144]
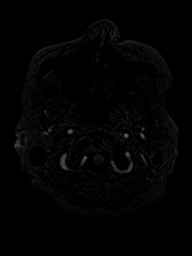
[im 18/144]
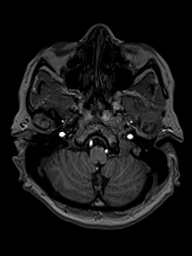
[im 36/144]
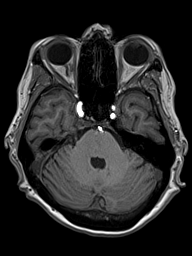
[im 54/144]
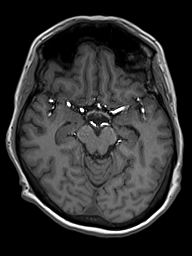
[im 72/144]
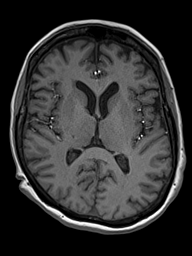
[im 90/144]
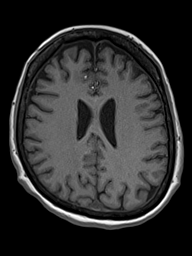
[im 108/144]
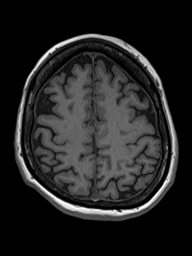
[im 126/144]
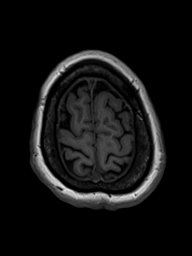
[im 144/144]
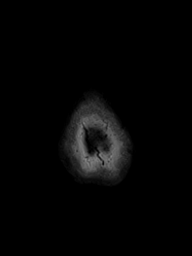

[Series 15: T2 post-contrast · coronal · 4.0mm · 0.36mm/px · 2 of 34 slices shown]
[im 1/34]
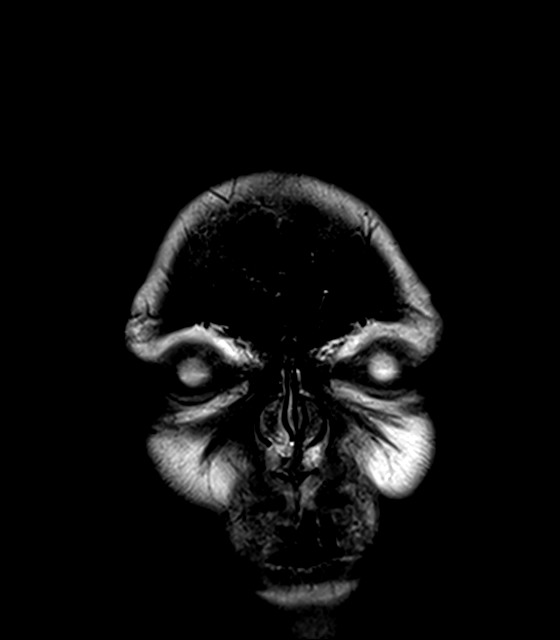
[im 34/34]
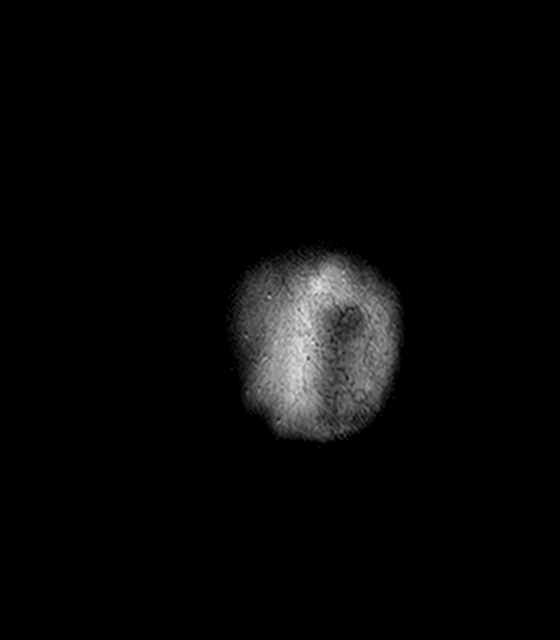

[Series 16: T1 · axial · 1.0mm · 0.90mm/px · z∈[-76,+67]mm · 9 of 144 slices shown (3 of 3)]
[im 1/144]
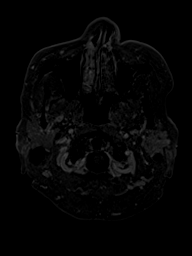
[im 18/144]
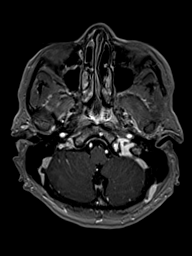
[im 36/144]
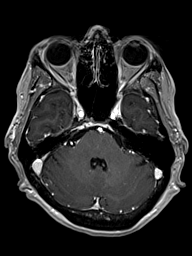
[im 54/144]
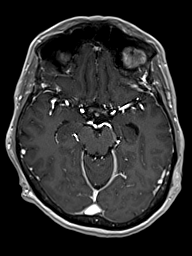
[im 72/144]
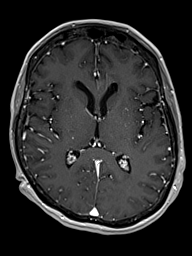
[im 90/144]
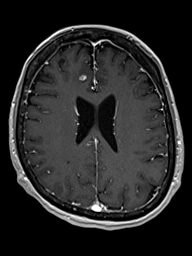
[im 108/144]
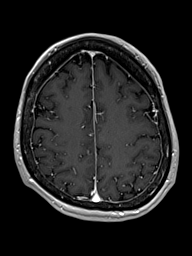
[im 126/144]
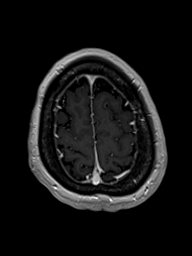
[im 144/144]
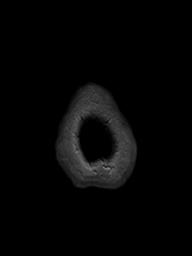

[Series 17: T1 post-contrast · coronal · 4.0mm · 0.72mm/px · 2 of 34 slices shown (1 of 2)]
[im 1/34]
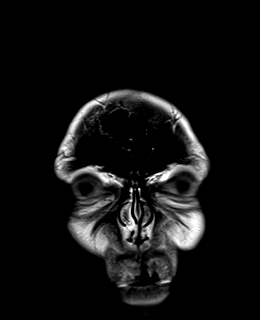
[im 34/34]
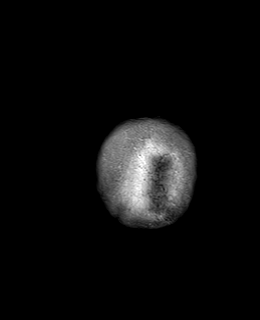

[Series 18: T1 post-contrast · sagittal · 4.0mm · 0.75mm/px · 2 of 31 slices shown (2 of 2)]
[im 1/31]
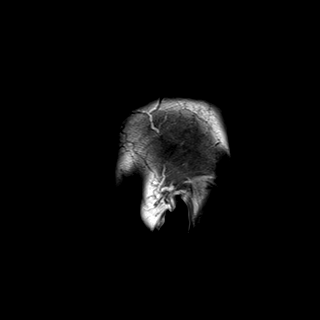
[im 31/31]
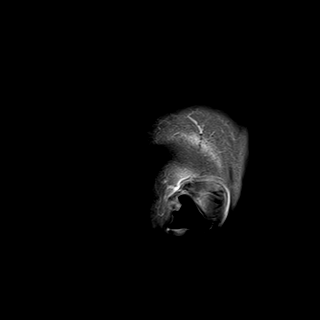

[48 of 48 positions shown; findings below may reference images not displayed]

FINDINGS: Brain: Mildly hemorrhagic and enhancing 8 mm metastasis in the
medial right anterior frontal lobe near the cingulate gyrus (series
16, image 90), corresponding to the area of parenchymal edema on the
noncontrast study in February 2016. The edema at this site has
regressed by 50% or more, and there is no longer mild regional mass
effect.

There is a second oval 7-8 mm enhancing metastasis along the
anterior right frontal lobe cortex (series 16, image 67) which is
not associated with hemorrhage or surrounding edema. There is FLAIR
hyperintensity corresponding to the mass itself (series 11, image
13) which is new since 9365.

No other abnormal enhancement or intracranial mass lesion
identified. The cavernous sinus appears normal. No dural thickening
identified. No other chronic cerebral blood products. Outside of the
above gray and white matter signal is largely unremarkable. Mild
scattered small nonspecific subcortical white matter signal changes
and a small stable T2 and FLAIR hyperintense focus in the left pons
are again noted.

No restricted diffusion to suggest acute infarction. No midline
shift, mass effect, ventriculomegaly, extra-axial collection or
acute intracranial hemorrhage. Cervicomedullary junction and
pituitary are within normal limits.

Vascular: Major intracranial vascular flow voids are preserved.

Skull and upper cervical spine: Negative visualized cervical spinal
cord. Visible bone marrow signal is stable and within normal limits.

Sinuses/Orbits: Orbits soft tissues appears symmetric and normal.

Visualized paranasal sinuses are stable and well pneumatized.

Other: Stable fluid in the right petrous apex, likely
postinflammatory. Mastoids remain well pneumatized. Visible internal
auditory structures appear normal. Scalp and face soft tissues
appear negative.
IMPRESSION: 1. Small 8 mm cortical metastasis along the anterior right frontal
lobe (series 16, image 67) with no associated hemorrhage, edema, or
mass effect.
2. Second but probably treated brain metastasis in the medial right
frontal lobe near the cingulate gyrus, corresponding to the area of
cerebral edema on the noncontrast study in February 2016.
3. No other metastatic disease or acute intracranial abnormality
identified. No explanation for left eye drooping.

## 2019-08-12 ENCOUNTER — Telehealth: Payer: Self-pay

## 2019-08-12 ENCOUNTER — Other Ambulatory Visit: Payer: Self-pay | Admitting: Family Medicine

## 2019-08-12 NOTE — Telephone Encounter (Signed)
Patient requesting provider place orders for thyroid functioning panel. Patient states that provider usually has her come in for lab visits every few months. Informed patient that provider may requests that she schedule appointment for follow up. Patient verbalized understanding and will await further instructions.   To PCP  Talbot Grumbling, RN

## 2019-08-12 NOTE — Telephone Encounter (Signed)
Please have her come in for an appointment.  Matilde Haymaker, MD

## 2019-08-16 NOTE — Telephone Encounter (Signed)
Called patient. Phone went straight to VM. Confidential VM left for patient to return phone call to office to schedule appointment.   Talbot Grumbling, RN

## 2019-09-26 DIAGNOSIS — C7800 Secondary malignant neoplasm of unspecified lung: Secondary | ICD-10-CM | POA: Diagnosis not present

## 2019-09-26 DIAGNOSIS — F319 Bipolar disorder, unspecified: Secondary | ICD-10-CM | POA: Diagnosis not present

## 2019-09-26 DIAGNOSIS — C7931 Secondary malignant neoplasm of brain: Secondary | ICD-10-CM | POA: Diagnosis not present

## 2019-09-26 DIAGNOSIS — G40909 Epilepsy, unspecified, not intractable, without status epilepticus: Secondary | ICD-10-CM | POA: Diagnosis not present

## 2019-09-26 DIAGNOSIS — Z79899 Other long term (current) drug therapy: Secondary | ICD-10-CM | POA: Diagnosis not present

## 2019-11-01 ENCOUNTER — Other Ambulatory Visit: Payer: Self-pay | Admitting: Family Medicine

## 2019-11-10 DIAGNOSIS — R569 Unspecified convulsions: Secondary | ICD-10-CM | POA: Diagnosis not present

## 2019-11-10 DIAGNOSIS — R9401 Abnormal electroencephalogram [EEG]: Secondary | ICD-10-CM | POA: Diagnosis not present

## 2019-11-10 DIAGNOSIS — Z79899 Other long term (current) drug therapy: Secondary | ICD-10-CM | POA: Diagnosis not present

## 2019-11-10 DIAGNOSIS — G40919 Epilepsy, unspecified, intractable, without status epilepticus: Secondary | ICD-10-CM | POA: Diagnosis not present

## 2019-11-15 DIAGNOSIS — G40919 Epilepsy, unspecified, intractable, without status epilepticus: Secondary | ICD-10-CM | POA: Diagnosis not present

## 2019-11-15 DIAGNOSIS — R202 Paresthesia of skin: Secondary | ICD-10-CM | POA: Diagnosis not present

## 2019-11-15 DIAGNOSIS — R200 Anesthesia of skin: Secondary | ICD-10-CM | POA: Diagnosis not present

## 2019-11-26 ENCOUNTER — Other Ambulatory Visit: Payer: Self-pay | Admitting: Family Medicine

## 2019-12-02 ENCOUNTER — Other Ambulatory Visit: Payer: Self-pay | Admitting: Family Medicine

## 2019-12-22 DIAGNOSIS — G40919 Epilepsy, unspecified, intractable, without status epilepticus: Secondary | ICD-10-CM | POA: Diagnosis not present

## 2019-12-22 DIAGNOSIS — G40209 Localization-related (focal) (partial) symptomatic epilepsy and epileptic syndromes with complex partial seizures, not intractable, without status epilepticus: Secondary | ICD-10-CM | POA: Diagnosis not present

## 2020-01-10 ENCOUNTER — Ambulatory Visit: Payer: Medicaid Other | Admitting: Family Medicine

## 2020-01-17 ENCOUNTER — Ambulatory Visit: Payer: Medicaid Other | Admitting: Family Medicine

## 2020-02-14 DIAGNOSIS — G40209 Localization-related (focal) (partial) symptomatic epilepsy and epileptic syndromes with complex partial seizures, not intractable, without status epilepticus: Secondary | ICD-10-CM | POA: Diagnosis not present

## 2020-02-14 DIAGNOSIS — R569 Unspecified convulsions: Secondary | ICD-10-CM | POA: Diagnosis not present

## 2020-02-15 DIAGNOSIS — G40209 Localization-related (focal) (partial) symptomatic epilepsy and epileptic syndromes with complex partial seizures, not intractable, without status epilepticus: Secondary | ICD-10-CM | POA: Diagnosis not present

## 2020-03-07 ENCOUNTER — Other Ambulatory Visit: Payer: Self-pay

## 2020-03-07 MED ORDER — DULOXETINE HCL 30 MG PO CPEP: 30.0000 mg | ORAL_CAPSULE | Freq: Two times a day (BID) | ORAL | 1 refills | Status: DC

## 2020-03-16 ENCOUNTER — Encounter: Payer: Self-pay | Admitting: Family Medicine

## 2020-03-16 ENCOUNTER — Other Ambulatory Visit: Payer: Self-pay

## 2020-03-16 ENCOUNTER — Ambulatory Visit (INDEPENDENT_AMBULATORY_CARE_PROVIDER_SITE_OTHER): Payer: Medicaid Other | Admitting: Family Medicine

## 2020-03-16 VITALS — BP 112/78 | HR 81 | Ht 63.0 in | Wt 144.0 lb

## 2020-03-16 DIAGNOSIS — E785 Hyperlipidemia, unspecified: Secondary | ICD-10-CM

## 2020-03-16 DIAGNOSIS — K5903 Drug induced constipation: Secondary | ICD-10-CM | POA: Diagnosis not present

## 2020-03-16 DIAGNOSIS — Z1159 Encounter for screening for other viral diseases: Secondary | ICD-10-CM

## 2020-03-16 DIAGNOSIS — E039 Hypothyroidism, unspecified: Secondary | ICD-10-CM | POA: Diagnosis not present

## 2020-03-16 DIAGNOSIS — E7800 Pure hypercholesterolemia, unspecified: Secondary | ICD-10-CM

## 2020-03-16 DIAGNOSIS — K5900 Constipation, unspecified: Secondary | ICD-10-CM | POA: Insufficient documentation

## 2020-03-16 DIAGNOSIS — K5904 Chronic idiopathic constipation: Secondary | ICD-10-CM

## 2020-03-16 MED ORDER — LEVOTHYROXINE SODIUM 112 MCG PO TABS: 112.0000 ug | ORAL_TABLET | ORAL | 3 refills | Status: DC

## 2020-03-16 MED ORDER — POLYETHYLENE GLYCOL 3350 17 G PO PACK: 17.0000 g | PACK | Freq: Two times a day (BID) | ORAL | 2 refills | Status: DC | PRN

## 2020-03-16 NOTE — Patient Instructions (Signed)
It was great seeing you today.  Regarding your stomach pain I would like for you to clarify with the pharmacist about the amount of MiraLAX she should be taking.  I am glad that you are getting some relief from this.  Regarding your thyroid medication concerns I have sent a new prescription but if it does not run please call the clinic and I will have one of our attending send it in.  We are collecting some lab work today including a lipid panel as well as routine screening that we do for hepatitis.  I would like to see you in 1 month for follow-up to make sure that your symptoms are improving after starting her medications back.  You are also in need of a Pap smear.  Please schedule this visit when you leave.  I hope you have a wonderful afternoon!

## 2020-03-16 NOTE — Assessment & Plan Note (Signed)
Patient with elevated total cholesterol and LDL at previous check on 12/2018. -Recheck lipid panel today -We will discuss next steps at next visit in 1 month

## 2020-03-16 NOTE — Assessment & Plan Note (Signed)
Patient having difficulty filling levothyroxine.  Feeling fatigued and down which she associates with low thyroid hormone levels.  Discussed case with pharmacy and recent prescription. -We will follow-up TSH at next visit -Continue current 125 mcg dose

## 2020-03-16 NOTE — Assessment & Plan Note (Signed)
Patient having continued issues with constipation and abdominal pain.  Pain is relieved with bowel movements.  She has concerns regarding the dose of MiraLAX that she is taking.  Discussed utility and high-fiber diet. -Prescription sent for MiraLAX packets instead of prescription -Follow-up as needed

## 2020-03-16 NOTE — Progress Notes (Signed)
    SUBJECTIVE:   CHIEF COMPLAINT / HPI:   Stomach Pain  Patient reports a chronic issue of stomach pain which is related to constipation.  She will sometimes go up to 6 days without having a bowel movement.  She takes MiraLAX 3 times daily and says that she uses a big jar of MiraLAX and she uses 2 scoops in the morning, 2 scoops at lunch, 2 scoops with dinner.  She reports it was easier to dose when she just received the packets.  Most recent bowel movement was 2 days ago and was normal.  Thyroid issues  Patient reports that she has been unable to get her thyroid medication over the last month and has not taken it.  Says that she is had fatigue and has been feeling down which she associates with not having her thyroid medications.  Hyperlipidemia Most recent cholesterol panel showing elevated total cholesterol at 240.  This was performed on 12/24/2018.  OBJECTIVE:   BP 112/78   Pulse 81   Ht 5\' 3"  (1.6 m)   Wt 144 lb (65.3 kg)   LMP  (LMP Unknown)   SpO2 96%   BMI 25.51 kg/m   General: Chronically ill-appearing 54 year old female in no acute distress Cardiac: Regular rate and rhythm, no murmurs appreciated Respiratory: Normal work of breathing Abdomen: Soft, nontender, positive bowel sounds, patient reports that when she does have abdominal pain and is in her left lower quadrant and is relieved by bowel movements Psych: Fatigue and depression which she associates with the hypothyroidism.  Denies any SI or HI at this time.  Would like to correct her thyroid issues before addressing depression  PHQ9 SCORE ONLY 03/16/2020 02/25/2019 02/25/2019  PHQ-9 Total Score 17 14 1     ASSESSMENT/PLAN:   Hypothyroid Patient having difficulty filling levothyroxine.  Feeling fatigued and down which she associates with low thyroid hormone levels.  Discussed case with pharmacy and recent prescription. -We will follow-up TSH at next visit -Continue current 125 mcg dose   Constipation Patient  having continued issues with constipation and abdominal pain.  Pain is relieved with bowel movements.  She has concerns regarding the dose of MiraLAX that she is taking.  Discussed utility and high-fiber diet. -Prescription sent for MiraLAX packets instead of prescription -Follow-up as needed  Hyperlipidemia Patient with elevated total cholesterol and LDL at previous check on 12/2018. -Recheck lipid panel today -We will discuss next steps at next visit in 1 month     Gifford Shave, MD [place]

## 2020-03-17 LAB — LIPID PANEL
Chol/HDL Ratio: 3.1 ratio (ref 0.0–4.4)
Cholesterol, Total: 244 mg/dL — ABNORMAL HIGH (ref 100–199)
HDL: 79 mg/dL (ref >39)
LDL Chol Calc (NIH): 149 mg/dL — ABNORMAL HIGH (ref 0–99)
Triglycerides: 95 mg/dL (ref 0–149)
VLDL Cholesterol Cal: 16 mg/dL (ref 5–40)

## 2020-03-17 LAB — HEPATITIS C ANTIBODY: Hep C Virus Ab: <0.1 s/co ratio (ref 0.0–0.9)

## 2020-05-03 ENCOUNTER — Ambulatory Visit: Payer: Medicaid Other | Admitting: Family Medicine

## 2020-06-14 ENCOUNTER — Ambulatory Visit: Payer: Medicaid Other | Admitting: Family Medicine

## 2020-06-22 ENCOUNTER — Other Ambulatory Visit: Payer: Self-pay

## 2020-06-22 ENCOUNTER — Ambulatory Visit: Payer: Medicaid Other | Admitting: Family Medicine

## 2020-06-26 MED ORDER — BUPROPION HCL ER (XL) 150 MG PO TB24: 150.0000 mg | ORAL_TABLET | Freq: Every day | ORAL | 2 refills | Status: DC

## 2020-06-26 MED ORDER — DULOXETINE HCL 30 MG PO CPEP: 30.0000 mg | ORAL_CAPSULE | Freq: Two times a day (BID) | ORAL | 1 refills | Status: DC

## 2020-06-27 ENCOUNTER — Ambulatory Visit: Payer: Medicaid Other | Admitting: Family Medicine

## 2020-08-03 ENCOUNTER — Ambulatory Visit: Payer: Medicaid Other | Admitting: Family Medicine

## 2020-09-12 ENCOUNTER — Ambulatory Visit: Payer: Medicaid Other | Admitting: Family Medicine

## 2020-09-27 DIAGNOSIS — Z9189 Other specified personal risk factors, not elsewhere classified: Secondary | ICD-10-CM | POA: Diagnosis not present

## 2020-09-27 DIAGNOSIS — G40209 Localization-related (focal) (partial) symptomatic epilepsy and epileptic syndromes with complex partial seizures, not intractable, without status epilepticus: Secondary | ICD-10-CM | POA: Diagnosis not present

## 2020-09-27 DIAGNOSIS — C7931 Secondary malignant neoplasm of brain: Secondary | ICD-10-CM | POA: Diagnosis not present

## 2020-10-15 ENCOUNTER — Other Ambulatory Visit: Payer: Self-pay

## 2020-12-04 ENCOUNTER — Telehealth: Payer: Self-pay | Admitting: *Deleted

## 2020-12-04 NOTE — Telephone Encounter (Signed)
Request came in for a medicaid provider to send in pts Duloxetine but I see that she has appointment scheduled for med refills coming up.  Routing to PCP as an Pharmacist, hospital. Tracia Lacomb Zimmerman Rumple, CMA

## 2020-12-05 NOTE — Progress Notes (Deleted)
  SUBJECTIVE:   CHIEF COMPLAINT / HPI:   Hypothyroidism: Last TSH 0.114 on 05/02/2019.  Patient is currently taking 112 mcg daily.  Hyperlipidemia: Last lipid panel on 03/16/2020 showed cholesterol 244, HDL 79, LDL 149, cholesterol/HDL ratio 3.1 placing patient in half average risk.   Malignant melanoma history: Seen by [facility] neurology last seen on 09/27/2020 to plan to obtain CT head and CT C-spine with potential MRI for seizures and melanoma recurrence.  Provided Vimpat and Keppra by neurology, with most recent fall/episode on***. Followed by derm?  Major depressive disorder: Patient is currently taking Wellbutrin 150 mg daily Cymbalta 30 mg twice daily ***  PERTINENT  PMH / PSH: ***  OBJECTIVE:   LMP  (LMP Unknown)  ***  General: NAD, pleasant, able to participate in exam Cardiac: RRR, no murmurs. Respiratory: CTAB, normal effort, No wheezes, rales or rhonchi Abdomen: Bowel sounds present, nontender, nondistended, no hepatosplenomegaly. Extremities: no edema or cyanosis. Skin: warm and dry, no rashes noted Neuro: alert, no obvious focal deficits Psych: Normal affect and mood  ASSESSMENT/PLAN:   Major depressive disorder PHQ    Wells Guiles, DO [place]    {    This will disappear when note is signed, click to select method of visit    :1}

## 2020-12-06 ENCOUNTER — Ambulatory Visit: Payer: Medicaid Other | Admitting: Student

## 2020-12-06 NOTE — Assessment & Plan Note (Deleted)
PHQ

## 2020-12-06 NOTE — Assessment & Plan Note (Deleted)
Last lipid panel: Cholesterol 244, HDL 79, LDL 149, cholesterol/HDL ratio 3.1 placing patient in half average risk

## 2020-12-19 NOTE — Progress Notes (Signed)
SUBJECTIVE:   CHIEF COMPLAINT / HPI:    Hand: Patient presenting today with several concerns on her right hand.  She jammed her middle finger yesterday in addition to burning at after accidentally touching a hot stove.  These do not actively bother and have begun to heal.  She is able to make a fist and use her hand normally.  She states she also has a bruise on her wrist after hitting it on the cabinet several days prior.  Hypothyroidism: Last TSH 0.114 on 05/02/2019.  Patient is currently taking synthroid 112 mcg daily.  Requesting refill today.   Hyperlipidemia: Last lipid panel on 03/16/2020 showed cholesterol 244, HDL 79, LDL 149, cholesterol/HDL ratio 3.1 placing patient in half average risk. Not currently on a statin therapy.    Malignant melanoma history: Seen by [facility] neurology last seen on 09/27/2020 to plan to obtain CT head and CT C-spine with potential MRI for seizures and melanoma recurrence.  Provided Vimpat and Keppra by neurology.  She has never received skin exam before.  Patient has lost 30 pounds since December.  It is not painful to eat but she is uncomfortable eating as she has new teeth.  She is slowly being able to eat more.   PERTINENT  PMH / PSH: MDD, malignant melanoma, HLD, hypothyroidism, HTN.  OBJECTIVE:   BP 120/81   Pulse 82   Ht '5\' 3"'$  (1.6 m)   Wt 127 lb 9.6 oz (57.9 kg)   LMP  (LMP Unknown)   SpO2 100%   BMI 22.60 kg/m    General: NAD, pleasant, able to participate in exam Cardiac: RRR, no murmurs. Respiratory: CTAB, normal effort Right hand: Swollen PIP joint of third finger.  3 mm scab on anterior active PIP joint of third finger.  1 inch slightly purpuric bruise on the dorsal medial portion of wrist.  ASSESSMENT/PLAN:  Hypothyroid Patient currently on levothyroxine 112 mcg daily.  Looking back in records, TSH elevation never formally documented.  Concern the patient does not actively have hypothyroidism.  Synthroid refill placed but thyroid function  testing ordered for today.  May consider discontinuing at next visit depending on results.  Metastatic melanoma ([place]) Patient is actively in remission.  Does not have current follow-up with oncology or neurology.  Advised patient to follow-up with them.  She is never formally received a skin exam.  Dermatology referral placed for full skin exam.  Patient also requested Lyrica refill but there is no formal diagnosis of fibromyalgia as she endorsed and this has been refilled numerous times by heme-onc.  Healthcare maintenance Flu vaccine today.  BMP ordered.  Advised patient to follow-up in 2 to 4 weeks for health maintenance visit to address Pap smear, colonoscopy, mammogram, smoking cessation.  Hyperlipidemia Check lipid panel today.  Patient has eaten this morning so results will be nonfasting..  Bipolar 1 disorder ([place]) Patient endorses history of bipolar disorder.  Requested Cymbalta refill.  Not refilled as patient should be evaluated for bipolar disorder by psychiatry.  She is not currently followed by psychiatry therefore referral was placed.  Jammed finger (interphalangeal joint), left, initial encounter Non-concerning at this time. Able to curl finger and make fist well. Should self-resolve, relayed to pt.  Burn (any degree) involving less than 10% of body surface Minimal burn with scab over site of 3rd right finger. Apply Vaseline to the area if needed. Should self-resolve quickly, discussed with pt.  Bruise Minimal bruise on right hand. Physical exam unremarkable other than purpura.  Wrist and hand motion normal. No concerns at this time, reassured patient.  Weight loss Patient has lost 30 pounds since December as a result of eating difficulty due to malignant melanoma.  Progressively getting more as she is more comfortable with her new teeth.  Encouraged protein supplements but patient has financial trouble.  She stated she would try to eat more.  Weight loss can also be  correlated to thyroid pathology which is being assessed this visit.  Wells Guiles, [place]

## 2020-12-20 ENCOUNTER — Other Ambulatory Visit: Payer: Self-pay

## 2020-12-20 ENCOUNTER — Telehealth: Payer: Self-pay | Admitting: Student

## 2020-12-20 ENCOUNTER — Ambulatory Visit (INDEPENDENT_AMBULATORY_CARE_PROVIDER_SITE_OTHER): Payer: Medicaid Other | Admitting: Student

## 2020-12-20 ENCOUNTER — Encounter: Payer: Self-pay | Admitting: Student

## 2020-12-20 VITALS — BP 120/81 | HR 82 | Ht 63.0 in | Wt 127.6 lb

## 2020-12-20 DIAGNOSIS — F319 Bipolar disorder, unspecified: Secondary | ICD-10-CM | POA: Diagnosis not present

## 2020-12-20 DIAGNOSIS — R634 Abnormal weight loss: Secondary | ICD-10-CM | POA: Diagnosis not present

## 2020-12-20 DIAGNOSIS — T148XXA Other injury of unspecified body region, initial encounter: Secondary | ICD-10-CM | POA: Diagnosis not present

## 2020-12-20 DIAGNOSIS — C799 Secondary malignant neoplasm of unspecified site: Secondary | ICD-10-CM

## 2020-12-20 DIAGNOSIS — E039 Hypothyroidism, unspecified: Secondary | ICD-10-CM | POA: Diagnosis not present

## 2020-12-20 DIAGNOSIS — C439 Malignant melanoma of skin, unspecified: Secondary | ICD-10-CM

## 2020-12-20 DIAGNOSIS — Z23 Encounter for immunization: Secondary | ICD-10-CM

## 2020-12-20 DIAGNOSIS — Z0000 Encounter for general adult medical examination without abnormal findings: Secondary | ICD-10-CM | POA: Diagnosis not present

## 2020-12-20 DIAGNOSIS — S6992XA Unspecified injury of left wrist, hand and finger(s), initial encounter: Secondary | ICD-10-CM | POA: Diagnosis not present

## 2020-12-20 DIAGNOSIS — T310 Burns involving less than 10% of body surface: Secondary | ICD-10-CM | POA: Insufficient documentation

## 2020-12-20 DIAGNOSIS — E785 Hyperlipidemia, unspecified: Secondary | ICD-10-CM

## 2020-12-20 MED ORDER — LEVOTHYROXINE SODIUM 112 MCG PO TABS: 112.0000 ug | ORAL_TABLET | ORAL | 3 refills | Status: DC

## 2020-12-20 NOTE — Assessment & Plan Note (Signed)
Patient endorses history of bipolar disorder.  Requested Cymbalta refill.  Not refilled as patient should be evaluated for bipolar disorder by psychiatry.  She is not currently followed by psychiatry therefore referral was placed.

## 2020-12-20 NOTE — Assessment & Plan Note (Signed)
Flu vaccine today.  BMP ordered.  Advised patient to follow-up in 2 to 4 weeks for health maintenance visit to address Pap smear, colonoscopy, mammogram, smoking cessation.

## 2020-12-20 NOTE — Assessment & Plan Note (Signed)
Check lipid panel today.  Patient has eaten this morning so results will be nonfasting.Yolanda Krueger

## 2020-12-20 NOTE — Assessment & Plan Note (Signed)
Minimal burn with scab over site of 3rd right finger. Apply Vaseline to the area if needed. Should self-resolve quickly, discussed with pt.
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
## 2020-12-20 NOTE — Assessment & Plan Note (Signed)
Minimal bruise on right hand. Physical exam unremarkable other than purpura. Wrist and hand motion normal. No concerns at this time, reassured patient.

## 2020-12-20 NOTE — Assessment & Plan Note (Signed)
Non-concerning at this time. Able to curl finger and make fist well. Should self-resolve, relayed to pt.

## 2020-12-20 NOTE — Telephone Encounter (Signed)
Called patient at home phone after visit as during visit she was requesting Lyrica refill for fibromyalgia.  Diagnosis of this condition is not formally noted in her chart.  Previously prescribed by heme-onc.  Left voice message relating to patient that I am unable to refill this medication at this time and she would have to come back for formal diagnosis for talk with heme-onc as they were the ones to previously prescribe it.  Left clinic number to call back.

## 2020-12-20 NOTE — Assessment & Plan Note (Signed)
Patient has lost 30 pounds since December as a result of eating difficulty due to malignant melanoma.  Progressively getting more as she is more comfortable with her new teeth.  Encouraged protein supplements but patient has financial trouble.  She stated she would try to eat more.  Weight loss can also be correlated to thyroid pathology which is being assessed this visit.

## 2020-12-20 NOTE — Assessment & Plan Note (Addendum)
Patient currently on levothyroxine 112 mcg daily.  Looking back in records, TSH elevation never formally documented.  Concern the patient does not actively have hypothyroidism.  Synthroid refill placed but thyroid function testing ordered for today.  May consider discontinuing at next visit depending on results.

## 2020-12-20 NOTE — Assessment & Plan Note (Addendum)
Patient is actively in remission.  Does not have current follow-up with oncology or neurology.  Advised patient to follow-up with them.  She is never formally received a skin exam.  Dermatology referral placed for full skin exam.  Patient also requested Lyrica refill but there is no formal diagnosis of fibromyalgia as she endorsed and this has been refilled numerous times by heme-onc.

## 2020-12-21 ENCOUNTER — Telehealth: Payer: Self-pay | Admitting: Student

## 2020-12-21 NOTE — Telephone Encounter (Signed)
Called patient on home phone and left voice message regarding labs drawn at previous visit. Cholesterol levels slightly high but nothing concerning or requiring changes or additional medications at this time.  Advised patient to call back in clinic number.

## 2020-12-26 ENCOUNTER — Encounter: Payer: Self-pay | Admitting: Student

## 2020-12-28 LAB — BASIC METABOLIC PANEL
BUN/Creatinine Ratio: 9 (ref 9–23)
BUN: 6 mg/dL (ref 6–24)
CO2: 25 mmol/L (ref 20–29)
Calcium: 9.2 mg/dL (ref 8.7–10.2)
Chloride: 98 mmol/L (ref 96–106)
Creatinine, Ser: 0.65 mg/dL (ref 0.57–1.00)
Glucose: 82 mg/dL (ref 65–99)
Potassium: 4.1 mmol/L (ref 3.5–5.2)
Sodium: 138 mmol/L (ref 134–144)
eGFR: 104 mL/min/{1.73_m2} (ref >59)

## 2020-12-28 LAB — LIPID PANEL
Chol/HDL Ratio: 2.8 ratio (ref 0.0–4.4)
Cholesterol, Total: 201 mg/dL — ABNORMAL HIGH (ref 100–199)
HDL: 71 mg/dL (ref >39)
LDL Chol Calc (NIH): 109 mg/dL — ABNORMAL HIGH (ref 0–99)
Triglycerides: 120 mg/dL (ref 0–149)
VLDL Cholesterol Cal: 21 mg/dL (ref 5–40)

## 2020-12-28 LAB — TSH+T3+FREE T4+T3 FREE
Free T-3: 3.4 pg/mL
Free T4 by Dialysis: 2.2 ng/dL — ABNORMAL HIGH
TSH: 0.17 uU/mL
Triiodothyronine (T-3), Serum: 86 ng/dL

## 2021-01-01 ENCOUNTER — Ambulatory Visit: Payer: Medicaid Other | Admitting: Dermatology

## 2021-01-02 NOTE — Progress Notes (Deleted)
  SUBJECTIVE:   CHIEF COMPLAINT / HPI:   Healthcare maintenance: Due for pap, colonoscopy, and mammogram.   Hypothyroidism: Pt's thyroid levels were wnl except T4 which was elevated.   PERTINENT  PMH / PSH: Bipolar, malignant melanoma, hypothyroidism (?)  OBJECTIVE:   LMP  (LMP Unknown)  ***  General: NAD, pleasant, able to participate in exam Cardiac: RRR, no murmurs. Respiratory: CTAB, normal effort, No wheezes, rales or rhonchi Abdomen: Bowel sounds present, nontender, nondistended, no hepatosplenomegaly. Extremities: no edema or cyanosis. Skin: warm and dry, no rashes noted Neuro: alert, no obvious focal deficits Psych: Normal affect and mood  ASSESSMENT/PLAN:   No problem-specific Assessment & Plan notes found for this encounter.     Wells Guiles, DO [place]    {    This will disappear when note is signed, click to select method of visit    :1}

## 2021-01-02 NOTE — Assessment & Plan Note (Deleted)
Addressed pap smear, colonoscopy, and mammogram today.

## 2021-01-03 ENCOUNTER — Ambulatory Visit: Payer: Medicaid Other | Admitting: Student

## 2021-01-08 DIAGNOSIS — R5383 Other fatigue: Secondary | ICD-10-CM | POA: Diagnosis not present

## 2021-01-08 DIAGNOSIS — E039 Hypothyroidism, unspecified: Secondary | ICD-10-CM | POA: Diagnosis not present

## 2021-01-08 DIAGNOSIS — E559 Vitamin D deficiency, unspecified: Secondary | ICD-10-CM | POA: Diagnosis not present

## 2021-01-08 DIAGNOSIS — Z1159 Encounter for screening for other viral diseases: Secondary | ICD-10-CM | POA: Diagnosis not present

## 2021-01-08 DIAGNOSIS — Z0000 Encounter for general adult medical examination without abnormal findings: Secondary | ICD-10-CM | POA: Diagnosis not present

## 2021-02-08 DIAGNOSIS — Z124 Encounter for screening for malignant neoplasm of cervix: Secondary | ICD-10-CM | POA: Diagnosis not present

## 2021-02-13 DIAGNOSIS — Z79899 Other long term (current) drug therapy: Secondary | ICD-10-CM | POA: Diagnosis not present

## 2021-07-18 ENCOUNTER — Encounter: Payer: Self-pay | Admitting: Family Medicine

## 2021-07-18 ENCOUNTER — Ambulatory Visit (INDEPENDENT_AMBULATORY_CARE_PROVIDER_SITE_OTHER): Payer: Medicaid Other | Admitting: Family Medicine

## 2021-07-18 VITALS — BP 138/82 | HR 78 | Wt 122.2 lb

## 2021-07-18 DIAGNOSIS — Z1211 Encounter for screening for malignant neoplasm of colon: Secondary | ICD-10-CM | POA: Diagnosis not present

## 2021-07-18 DIAGNOSIS — K5903 Drug induced constipation: Secondary | ICD-10-CM | POA: Diagnosis not present

## 2021-07-18 DIAGNOSIS — C439 Malignant melanoma of skin, unspecified: Secondary | ICD-10-CM | POA: Diagnosis not present

## 2021-07-18 MED ORDER — PREGABALIN 75 MG PO CAPS: 75.0000 mg | ORAL_CAPSULE | Freq: Three times a day (TID) | ORAL | 0 refills | Status: DC

## 2021-07-18 MED ORDER — POLYETHYLENE GLYCOL 3350 17 G PO PACK: 17.0000 g | PACK | Freq: Two times a day (BID) | ORAL | 2 refills | Status: DC | PRN
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
## 2021-07-18 NOTE — Patient Instructions (Addendum)
It was great seeing you today.  I am concerned about your weight loss and I believe the most important thing that you can do for your health is follow-up with your cancer doctors.  Regarding your Pap smear concerns please schedule an appointment on your way out and we can collect a Pap smear here and see what the next steps are in your treatment.  I did refill your Lyrica today as well as your MiraLAX.  We do not need to do any labs today.  You do need a colonoscopy so I placed an order for you to see the stomach doctors.  If you have any questions or concerns call the clinic.  I hope you have a great day! ? ? ?

## 2021-08-09 ENCOUNTER — Telehealth: Payer: Self-pay | Admitting: Student

## 2021-08-09 NOTE — Telephone Encounter (Signed)
Pat walked in to request refill of: ? ?Name of Medication(s):  Lyrica and Synthroid ?Last date of OV:  07/18/21 ?Pharmacy:  Same ? ?Will route refill request to Clinic RN.  Discussed with patient policy to call pharmacy for future refills.  Also, discussed refills may take up to 48 hours to approve or deny.  Creig Hines  ?

## 2021-08-12 NOTE — Telephone Encounter (Signed)
Lyrica refilled during visit in April with refills. Further Rx not appropriate. This was also originally prescribed by heme-onc and no formal diagnosis given at that time. I will discuss with her during her visit. ? ?Lyrica refilled on 12/20/20 for 270 day supply. Patient should not be due for another refill until June. Additionally, needs updated labs as chart review does not reflect hypothyroidism with current dosage and may be too high. Will also discuss at her next appointment. ?

## 2021-08-19 ENCOUNTER — Ambulatory Visit: Payer: Medicaid Other | Admitting: Student

## 2021-08-19 ENCOUNTER — Other Ambulatory Visit: Payer: Self-pay | Admitting: Family Medicine

## 2021-08-19 NOTE — Progress Notes (Deleted)
  SUBJECTIVE:   CHIEF COMPLAINT / HPI: Pap smear  Patient presents today for annual Pap smear.  Hypothyroidism: Currently taking Synthroid 112 mcg daily.  PERTINENT  PMH / PSH: ***  Past Medical History:  Diagnosis Date   Bipolar 1 disorder ([place])    Cancer (HCC)    Hypothyroid    Malignant melanoma, metastatic ([place])    PTSD (post-traumatic stress disorder)     OBJECTIVE:  LMP  (LMP Unknown)   General: NAD, pleasant, able to participate in exam Cardiac: RRR, no murmurs auscultated. Respiratory: CTAB, normal effort, no wheezes, rales or rhonchi Abdomen: soft, non-tender, non-distended, normoactive bowel sounds Extremities: warm and well perfused, no edema or cyanosis. Skin: warm and dry, no rashes noted Neuro: alert, no obvious focal deficits, speech normal Psych: Normal affect and mood  ASSESSMENT/PLAN:  No problem-specific Assessment & Plan notes found for this encounter.   No orders of the defined types were placed in this encounter.  No orders of the defined types were placed in this encounter.  No follow-ups on file. Wells Guiles, DO 08/19/2021, 7:40 AM PGY-***, [facility] {    This will disappear when note is signed, click to select method of visit    :1}

## 2021-08-21 MED ORDER — LEVOTHYROXINE SODIUM 112 MCG PO TABS: 112.0000 ug | ORAL_TABLET | ORAL | 0 refills | Status: DC

## 2021-09-21 ENCOUNTER — Other Ambulatory Visit: Payer: Self-pay | Admitting: Family Medicine

## 2021-10-09 ENCOUNTER — Ambulatory Visit (INDEPENDENT_AMBULATORY_CARE_PROVIDER_SITE_OTHER): Payer: Medicaid Other | Admitting: Student

## 2021-10-09 ENCOUNTER — Encounter: Payer: Self-pay | Admitting: Student

## 2021-10-09 ENCOUNTER — Other Ambulatory Visit (HOSPITAL_COMMUNITY)
Admission: RE | Admit: 2021-10-09 | Discharge: 2021-10-09 | Disposition: A | Discharge status: Home / Self Care | Payer: Medicaid Other | Source: Ambulatory Visit | Attending: Family Medicine | Admitting: Family Medicine

## 2021-10-09 VITALS — BP 121/85 | HR 101 | Ht 63.0 in | Wt 121.8 lb

## 2021-10-09 DIAGNOSIS — F319 Bipolar disorder, unspecified: Secondary | ICD-10-CM | POA: Diagnosis not present

## 2021-10-09 DIAGNOSIS — Z124 Encounter for screening for malignant neoplasm of cervix: Secondary | ICD-10-CM | POA: Insufficient documentation

## 2021-10-09 DIAGNOSIS — E039 Hypothyroidism, unspecified: Secondary | ICD-10-CM | POA: Diagnosis not present

## 2021-10-09 DIAGNOSIS — Z1211 Encounter for screening for malignant neoplasm of colon: Secondary | ICD-10-CM | POA: Diagnosis not present

## 2021-10-09 DIAGNOSIS — C439 Malignant melanoma of skin, unspecified: Secondary | ICD-10-CM | POA: Diagnosis not present

## 2021-10-09 DIAGNOSIS — E785 Hyperlipidemia, unspecified: Secondary | ICD-10-CM

## 2021-10-09 MED ORDER — LEVOTHYROXINE SODIUM 100 MCG PO TABS: 100.0000 ug | ORAL_TABLET | ORAL | 1 refills | Status: DC
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
## 2021-10-09 NOTE — Assessment & Plan Note (Signed)
No previous records found. Unable to see records of Pap smear from [facility]. Reperformed today.

## 2021-10-09 NOTE — Assessment & Plan Note (Addendum)
Psychiatry referral again placed for management of bipolar disorder.

## 2021-10-09 NOTE — Assessment & Plan Note (Signed)
Labs on current medication reflected hyperthyroidism. Will tailor back dosage to synthroid 100 mcg daily. Rx sent. Continue to monitor and adjust accordingly.

## 2021-10-09 NOTE — Assessment & Plan Note (Signed)
Previous LDL elevated to 109. Patient made dietary changes. Recheck lipid panel and BMP during lab appointment. Will likely initiate statin after results.

## 2021-10-09 NOTE — Assessment & Plan Note (Addendum)
Followed by oncology. Attempting to establish with Oncologist in [place]. Message sent to schedule patient in dermatology clinic for skin exam as she has never had one.   Requested Lyrica refill for Fibromyalgia which per patient was diagnosed at cancer center. Advised that while this was refilled here during April visit, will not refill at this time. If oncology is unwilling to refill, advise formal visit for discussion of fibromyalgia so I can perform proper workup and decision making.

## 2021-10-09 NOTE — Progress Notes (Signed)
SUBJECTIVE:   CHIEF COMPLAINT / HPI:   Pap smear: no previous results found in charting. She states she had one done recently at [facility] but was treated very poorly there and wants to have it redone.  Mammogram: bilateral mastectomy around the age of 44.  Metastatic melanoma: actively in remission, currently seeing Dr. Harriet Masson but attempting to get switched to oncologist in [place]. Never had full skin exam.   Requesting Lyrica refill. Diagnosed with Fibromyalgia by cancer center.   PERTINENT  PMH / PSH: Bipolar 1, hypothyroidism, metastatic melanoma (remission)  OBJECTIVE:  BP 121/85   Pulse (!) 101   Ht '5\' 3"'$  (1.6 m)   Wt 121 lb 12.8 oz (55.2 kg)   LMP  (LMP Unknown)   SpO2 99%   BMI 21.58 kg/m   General: NAD, pleasant, able to participate in exam Cardiac: RRR, no murmurs auscultated Respiratory: CTAB, normal WOB Psych: Normal affect and mood  Pelvic Exam:        External: normal female genitalia without lesions or masses        Vagina: normal without lesions or masses        Cervix: normal without lesions or masses, trace bleeding at os after procedure        Pap smear: performed  Chaperone: April Zimmerman Rumple  ASSESSMENT/PLAN:  Encounter for screening colonoscopy Colonoscopy with biopsy in the past (2017) due in 5 years. Referral placed for colonoscopy.  Hypothyroid Labs on current medication reflected hyperthyroidism. Will tailor back dosage to synthroid 100 mcg daily. Rx sent. Continue to monitor and adjust accordingly.   Metastatic melanoma ([place]) Followed by oncology. Attempting to establish with Oncologist in [place]. Message sent to schedule patient in dermatology clinic for skin exam as she has never had one.   Requested Lyrica refill for Fibromyalgia which per patient was diagnosed at cancer center. Advised that while this was refilled here during April visit, will not refill at this time. If oncology is unwilling to refill,  advise formal visit for discussion of fibromyalgia so I can perform proper workup and decision making.   Hyperlipidemia Previous LDL elevated to 109. Patient made dietary changes. Recheck lipid panel and BMP during lab appointment. Will likely initiate statin after results.   Encounter for Papanicolaou smear for cervical cancer screening No previous records found. Unable to see records of Pap smear from [facility]. Reperformed today.   Bipolar 1 disorder [facility]) Psychiatry referral again placed for management of bipolar disorder.   Patient left visit early and did not receive AVS nor completion of visit follow up instructions.    Orders Placed This Encounter  Procedures   Basic Metabolic Panel    Standing Status:   Future    Standing Expiration Date:   10/10/2022   Lipid Panel    Standing Status:   Future    Standing Expiration Date:   10/10/2022   Ambulatory referral to Psychiatry    Referral Priority:   Routine    Referral Type:   Psychiatric    Referral Reason:   Specialty Services Required    Requested Specialty:   Psychiatry    Number of Visits Requested:   1   Ambulatory referral to Gastroenterology    Referral Priority:   Routine    Referral Type:   Consultation    Referral Reason:   Specialty Services Required    Number of Visits Requested:   1   Meds ordered this encounter  Medications  levothyroxine (SYNTHROID) 100 MCG tablet    Sig: Take 1 tablet (100 mcg total) by mouth every morning. 30 minutes before food    Dispense:  90 tablet    Refill:  1   Return in about 1 week (around 10/16/2021) for full skin exam. Wells Guiles, DO 10/09/2021, 7:21 PM PGY-2, [place]

## 2021-10-09 NOTE — Assessment & Plan Note (Addendum)
Colonoscopy with biopsy in the past (2017) due in 5 years. Referral placed for colonoscopy.

## 2021-10-10 ENCOUNTER — Telehealth: Payer: Self-pay | Admitting: *Deleted

## 2021-10-10 ENCOUNTER — Encounter: Payer: Self-pay | Admitting: Student

## 2021-10-10 LAB — CYTOLOGY - PAP
Chlamydia: NEGATIVE
Comment: NEGATIVE
Comment: NEGATIVE
Comment: NEGATIVE
Comment: NORMAL
Diagnosis: NEGATIVE
High risk HPV: NEGATIVE
Neisseria Gonorrhea: NEGATIVE
Trichomonas: NEGATIVE

## 2021-10-10 NOTE — Telephone Encounter (Signed)
LVM for pt to call back, if she calls back please assist her in getting a lab appointment scheduled.  She had to leave at her appointment and was not aboe to have them drawn that day. Carlethia Mesquita Zimmerman Rumple, CMA

## 2021-10-10 NOTE — Telephone Encounter (Signed)
-----   Message from Wells Guiles, DO sent at 10/09/2021 10:26 AM EDT ----- Regarding: Lab appointment Reminder to please call patient to schedule for lab appointment.

## 2021-10-28 NOTE — Telephone Encounter (Signed)
LVM for pt to call office.  If she calls back please assist in getting her a lab only visit from her last visit where she had to leave before having them done.Siegfried Vieth Zimmerman Rumple, CMA

## 2021-10-31 ENCOUNTER — Telehealth: Payer: Self-pay | Admitting: *Deleted

## 2021-10-31 NOTE — Telephone Encounter (Signed)
Patient LVM on nurse line returning call. Attempted to return call to patient. Phone went straight to VM. Left VM asking patient to return call to office.   Talbot Grumbling, RN

## 2021-10-31 NOTE — Telephone Encounter (Signed)
LVM to call office back to schedule lab visit.  Have made several unsuccessful attempts will also mail letter to address in chart. Yolanda Krueger, CMA

## 2021-11-18 ENCOUNTER — Other Ambulatory Visit: Payer: Medicaid Other

## 2021-11-18 DIAGNOSIS — E785 Hyperlipidemia, unspecified: Secondary | ICD-10-CM | POA: Diagnosis not present

## 2021-11-19 ENCOUNTER — Telehealth: Payer: Self-pay | Admitting: Student

## 2021-11-19 DIAGNOSIS — E785 Hyperlipidemia, unspecified: Secondary | ICD-10-CM

## 2021-11-19 LAB — BASIC METABOLIC PANEL
BUN/Creatinine Ratio: 15 (ref 9–23)
BUN: 12 mg/dL (ref 6–24)
CO2: 25 mmol/L (ref 20–29)
Calcium: 9 mg/dL (ref 8.7–10.2)
Chloride: 99 mmol/L (ref 96–106)
Creatinine, Ser: 0.78 mg/dL (ref 0.57–1.00)
Glucose: 75 mg/dL (ref 70–99)
Potassium: 4.1 mmol/L (ref 3.5–5.2)
Sodium: 138 mmol/L (ref 134–144)
eGFR: 89 mL/min/{1.73_m2} (ref >59)

## 2021-11-19 LAB — LIPID PANEL
Chol/HDL Ratio: 2.5 ratio (ref 0.0–4.4)
Cholesterol, Total: 200 mg/dL — ABNORMAL HIGH (ref 100–199)
HDL: 80 mg/dL (ref >39)
LDL Chol Calc (NIH): 108 mg/dL — ABNORMAL HIGH (ref 0–99)
Triglycerides: 65 mg/dL (ref 0–149)
VLDL Cholesterol Cal: 12 mg/dL (ref 5–40)

## 2021-11-19 MED ORDER — ATORVASTATIN CALCIUM 40 MG PO TABS: 40.0000 mg | ORAL_TABLET | Freq: Every day | ORAL | 3 refills | Status: DC

## 2021-11-19 NOTE — Telephone Encounter (Signed)
Called patient, verified date of birth, to discuss most recent lipid panel with LDL 108.  Advised starting statin to which patient was amenable.  Rx placed for atorvastatin 40 mg daily.  Recheck LDL in 6 weeks and advised patient to call back to schedule appointment for mid October.

## 2022-01-08 NOTE — Progress Notes (Deleted)
  SUBJECTIVE:   CHIEF COMPLAINT / HPI:   Hyperlipidemia: LDL 108 approximately 8 weeks ago, initiated atorvastatin 40 mg daily.    PERTINENT  PMH / PSH: Bipolar 1, hypothyroidism, metastatic melanoma (remission), HLD  OBJECTIVE:  LMP  (LMP Unknown)   General: NAD, pleasant, able to participate in exam Cardiac: RRR, no murmurs auscultated Respiratory: CTAB, normal WOB Abdomen: soft, non-tender, non-distended, normoactive bowel sounds Extremities: warm and well perfused, no edema or cyanosis Skin: warm and dry, no rashes noted Neuro: alert, no obvious focal deficits, speech normal Psych: Normal affect and mood  ASSESSMENT/PLAN:  There are no diagnoses linked to this encounter. No follow-ups on file. Wells Guiles, DO 01/08/2022, 10:13 AM PGY-***, [facility] {    This will disappear when note is signed, click to select method of visit    :1}

## 2022-01-09 ENCOUNTER — Ambulatory Visit: Payer: Medicaid Other | Admitting: Student

## 2022-01-09 DIAGNOSIS — E785 Hyperlipidemia, unspecified: Secondary | ICD-10-CM

## 2022-01-12 DIAGNOSIS — H5213 Myopia, bilateral: Secondary | ICD-10-CM | POA: Diagnosis not present

## 2022-01-23 NOTE — Progress Notes (Signed)
  SUBJECTIVE:   CHIEF COMPLAINT / HPI:   HLD: LDL 108 on 11/18/2021, started on Lipitor 40 mg daily.  Endorses compliance.  Metastatic melanoma: She will be reaching back out to dermatology for skin exams in the spring.  She was seeing [facility] neurology to some capacity but after discussion with them, would like to be seen more locally in [place].  Requesting referral today.    Bipolar 1 disorder: She needs to get into psychiatry but states she has been playing phone tag with them. Requesting refill of trazodone to help her sleep better.   She got her flu vaccine at [facility] 2 weeks ago.  PERTINENT  PMH / PSH: Metastatic melanoma, bipolar 1 disorder, hypothyroidism, HLD  OBJECTIVE:  BP 135/81   Pulse 69   Wt 122 lb 12.8 oz (55.7 kg)   LMP  (LMP Unknown)   SpO2 99%   BMI 21.75 kg/m  Physical Exam Constitutional:      Appearance: Normal appearance.  Neurological:     Mental Status: She is alert.  Psychiatric:        Mood and Affect: Mood normal.        Behavior: Behavior normal.    ASSESSMENT/PLAN:  Hyperlipidemia, unspecified hyperlipidemia type Assessment & Plan: Continue atorvastatin 40 mg daily.  Recheck LDL.  Orders: -     LDL cholesterol, direct  Metastatic melanoma ([place]) Assessment & Plan: Followed by oncology, who is also the prescriber of her Lyrica.  I have continued to state that it is their responsibility to refill this as I have not been the one to diagnose fibromyalgia.  She states understanding of this.  Referral to neurology for local neurology following regarding previous metastatic disease from melanoma.  She endorses compliance on her Keppra 1000 mg twice daily, I am unsure if she is taking Vimpat.  Orders: -     Ambulatory referral to Neurology  Bipolar 1 disorder [facility]) Assessment & Plan: CMA attempted to reach out to [facility] for visit.  Left VM.   Encounter for screening colonoscopy Assessment & Plan: Referral  notes show patient declined colonoscopy.  Patient states she wants to wait until the spring time.  We will continue to discuss with patient given her cancer history.   Other orders -     traZODone HCl; TAKE 1 TABLET BY MOUTH EVERYDAY AT BEDTIME  Dispense: 30 tablet; Refill: 0  Return if symptoms worsen or fail to improve. Wells Guiles, DO 01/24/2022, 9:46 AM PGY-2, [place]

## 2022-01-24 ENCOUNTER — Ambulatory Visit (INDEPENDENT_AMBULATORY_CARE_PROVIDER_SITE_OTHER): Payer: Medicaid Other | Admitting: Student

## 2022-01-24 ENCOUNTER — Encounter: Payer: Self-pay | Admitting: Student

## 2022-01-24 VITALS — BP 135/81 | HR 69 | Wt 122.8 lb

## 2022-01-24 DIAGNOSIS — Z1211 Encounter for screening for malignant neoplasm of colon: Secondary | ICD-10-CM | POA: Diagnosis not present

## 2022-01-24 DIAGNOSIS — F319 Bipolar disorder, unspecified: Secondary | ICD-10-CM

## 2022-01-24 DIAGNOSIS — E785 Hyperlipidemia, unspecified: Secondary | ICD-10-CM

## 2022-01-24 DIAGNOSIS — C439 Malignant melanoma of skin, unspecified: Secondary | ICD-10-CM | POA: Diagnosis not present

## 2022-01-24 MED ORDER — TRAZODONE HCL 50 MG PO TABS
ORAL_TABLET | ORAL | 0 refills | Status: DC
Start: 2022-01-24 — End: 2022-05-23

## 2022-01-24 NOTE — Assessment & Plan Note (Signed)
Continue atorvastatin 40 mg daily.  Recheck LDL.

## 2022-01-24 NOTE — Assessment & Plan Note (Signed)
CMA attempted to reach out to [facility] at [facility] for visit.  Left VM.

## 2022-01-24 NOTE — Assessment & Plan Note (Signed)
Followed by oncology, who is also the prescriber of her Lyrica.  I have continued to state that it is their responsibility to refill this as I have not been the one to diagnose fibromyalgia.  She states understanding of this.  Referral to neurology for local neurology following regarding previous metastatic disease from melanoma.  She endorses compliance on her Keppra 1000 mg twice daily, I am unsure if she is taking Vimpat.

## 2022-01-24 NOTE — Patient Instructions (Addendum)
It was great to see you today! Thank you for choosing [facility] for your primary care. Yolanda Krueger was seen for hyperlipidemia.  Today we addressed: Hyperlipidemia: We are rechecking your cholesterol today. Melanoma: I placed a referral to neurology so that you may get follow-up. We are trying to get you scheduled for a psychiatry appointment.  If you haven't already, sign up for My Chart to have easy access to your labs results, and communication with your primary care physician.  We are checking some labs today. If they are abnormal, I will call you. If they are normal, I will send you a MyChart message (if it is active) or a letter in the mail. If you do not hear about your labs in the next 2 weeks, please call the office. Call the clinic at [phone] if your symptoms worsen or you have any concerns.  You should return to our clinic Return if symptoms worsen or fail to improve. Please arrive 15 minutes before your appointment to ensure smooth check in process.  We appreciate your efforts in making this happen.  Thank you for allowing me to participate in your care, Wells Guiles, DO 01/24/2022, 9:05 AM PGY-2, [place]

## 2022-01-24 NOTE — Assessment & Plan Note (Signed)
Referral notes show patient declined colonoscopy.  Patient states she wants to wait until the spring time.  We will continue to discuss with patient given her cancer history.

## 2022-01-27 ENCOUNTER — Telehealth: Payer: Self-pay

## 2022-01-27 NOTE — Telephone Encounter (Signed)
Called pt to inform her that I have made the following appointments: Please inform pt.  [place]  11-2 @ 10:00 AM Medication management 11-7 @ 10:00 AM Therapy  Ottis Stain, CMA

## 2022-01-29 ENCOUNTER — Other Ambulatory Visit (HOSPITAL_COMMUNITY): Payer: Self-pay

## 2022-01-29 ENCOUNTER — Other Ambulatory Visit: Payer: Self-pay | Admitting: Student

## 2022-01-29 ENCOUNTER — Encounter: Payer: Self-pay | Admitting: Student

## 2022-01-29 DIAGNOSIS — Z202 Contact with and (suspected) exposure to infections with a predominantly sexual mode of transmission: Secondary | ICD-10-CM

## 2022-01-29 LAB — LDL CHOLESTEROL, DIRECT: LDL Direct: 83 mg/dL (ref 0–99)

## 2022-01-29 MED ORDER — DOXYCYCLINE HYCLATE 50 MG PO CAPS
100.0000 mg | ORAL_CAPSULE | Freq: Two times a day (BID) | ORAL | 0 refills | Status: AC
  Filled 2022-01-29: qty 112, 28d supply, fill #0

## 2022-01-29 NOTE — Progress Notes (Signed)
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   Will treat empirically for syphilis to avoid risk of under-treatment.   Spoke with patient on the phone with her partner and discussed management options.  She has a known allergy to penicillin as an infant and would like ot be treated with oral medication.   Prescribed 100 mg doxycycline BID for 28 days. Patient will follow up in clinic with her PCP.   Darci Current, DO [facility], PGY-1 01/29/22 11:59 AM

## 2022-01-30 ENCOUNTER — Other Ambulatory Visit (HOSPITAL_COMMUNITY): Payer: Self-pay

## 2022-02-04 NOTE — Telephone Encounter (Signed)
Called pt again and LVM asking for her to call as we have important appointment information to give her. If pt calls, please give her the following information. Ottis Stain, CMA

## 2022-02-05 NOTE — Patient Instructions (Incomplete)
Thank you for attending your appointment today.  -- *** -- Continue other medications as prescribed.  Please do not make any changes to medications without first discussing with your provider. If you are experiencing a psychiatric emergency, please call 911 or present to your nearest emergency department. Additional crisis, medication management, and therapy resources are included below.  [facility]  [address], [place], [place] [address] [phone] WALK-IN URGENT CARE 24/7 FOR ANYONE [address], , [place]  [phone] Fax: [phone] [email or web address] *Interpreters available *Accepts all insurance and uninsured for Urgent Care needs *Accepts Medicaid and uninsured for outpatient treatment (below)      ONLY FOR [place] Residents  Below:    Outpatient New Patient Assessment/Therapy Walk-ins:        Monday -Thursday 8am until slots are full.        Every Friday 1pm-4pm  (first come, first served)                   New Patient Psychiatry/Medication Management        Monday-Friday 8am-11am (first come, first served)               For all walk-ins we ask that you arrive by 7:15am, because patients will be seen in the order of arrival.   

## 2022-02-05 NOTE — Progress Notes (Deleted)
Psychiatric Initial Adult Assessment  Patient Identification: Yolanda Krueger MRN:  [id number] Date of Evaluation:  02/05/2022 Referral Source: Madison Hickman, MD  Assessment:  Yolanda Krueger is a 56 y.o. 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  Patient reports ***  Plan:  # *** Past medication trials:  Status of problem: *** Interventions: -- ***  # *** Past medication trials:  Status of problem: *** Interventions: -- ***  # *** Past medication trials:  Status of problem: *** Interventions: -- ***  Patient was given contact information for behavioral health clinic and was instructed to call 911 for emergencies.   Subjective:  Chief Complaint: No chief complaint on file.   History of Present Illness:  *** Seen by [facility] [place] Feb 2018: reported diagnoses of bipolar 2 disorder, anxiety, and insomnia. Managed on Seroquel 150 mg nightly, Cymbalta 60 mg, and trazodone 50-100 mg for sleep.   History of bipolar 1 vs MDD  Current medications: Cymbalta 60 mg Vimpat 100 mg BID Trazodone 50 mg nightly  Rx Keppra 1000 mg BID  Consider AP? Seroquel, abilify?  Past Psychiatric History:  Diagnoses: ***past concern for bipolar 1 vs. 2 disorder, PTSD, polysubstance use, MDD Medication trials: ***Seroquel, Depakote, Cymbalta, Klonopin Previous psychiatrist/therapist: *** Hospitalizations: *** Suicide attempts: *** SIB: *** Hx of violence towards others: *** Current access to guns: *** Hx of abuse: ***  Substance Abuse History in the last 12 months:  {yes no:314532}  Past Medical History:  Past Medical History:  Diagnosis Date   Bipolar 1 disorder ([place])    Cancer ([place])    Hypothyroid    Malignant melanoma, metastatic ([place])    PTSD (post-traumatic stress disorder)     Past Surgical History:   Procedure Laterality Date   MASTECTOMY Bilateral 2007   Date is approximated   MELANOMA EXCISION      Family Psychiatric History: ***  Family History:  Family History  Family history unknown: Yes    Social History:   Social History   Socioeconomic History   Marital status: Single    Spouse name: Not on file   Number of children: Not on file   Years of education: Not on file   Highest education level: Not on file  Occupational History   Not on file  Tobacco Use   Smoking status: Every Day    Types: Cigarettes   Smokeless tobacco: Never   Tobacco comments:     4 cigs a day. working on quitting  Substance and Sexual Activity   Alcohol use: No   Drug use: No    Types: Cocaine    Comment: denies   Sexual activity: Not on file  Other Topics Concern   Not on file  Social History Narrative   Not on file   Social Determinants of Health   Financial Resource Strain: Not on file  Food Insecurity: Not on file  Transportation Needs: Not on file  Physical Activity: Not on file  Stress: Not on file  Social Connections: Not on file    Additional Social History: ***  Allergies:   Allergies  Allergen Reactions   Penicillins Rash    Current Medications: Current Outpatient Medications  Medication Sig Dispense Refill   atorvastatin (LIPITOR) 40 MG tablet Take 1 tablet (40 mg total) by mouth daily. 90 tablet 3   doxycycline (VIBRAMYCIN) 50  MG capsule Take 2 capsules (100 mg total) by mouth 2 (two) times daily for 28 days. 112 capsule 0   DULoxetine (CYMBALTA) 60 MG capsule Take 60 mg by mouth daily.     levETIRAcetam (KEPPRA) 1000 MG tablet TAKE 1 TABLET BY MOUTH TWICE A DAY 60 tablet 1   levothyroxine (SYNTHROID) 100 MCG tablet Take 1 tablet (100 mcg total) by mouth every morning. 30 minutes before food 90 tablet 1   pregabalin (LYRICA) 75 MG capsule Take 1 capsule (75 mg total) by mouth 3 (three) times daily. 30 capsule 0   traZODone (DESYREL) 50 MG tablet TAKE 1  TABLET BY MOUTH EVERYDAY AT BEDTIME 30 tablet 0   VIMPAT 100 MG TABS TAKE 1 TABLET (100 MG TOTAL) BY MOUTH TWO (2) TIMES A DAY. 60 tablet 1   No current facility-administered medications for this visit.    ROS: Review of Systems  Objective:  Psychiatric Specialty Exam: There were no vitals taken for this visit.There is no height or weight on file to calculate BMI.  General Appearance: {Appearance:22683}  Eye Contact:  {BHH EYE CONTACT:22684}  Speech:  {Speech:22685}  Volume:  {Volume (PAA):22686}  Mood:  {BHH MOOD:22306}  Affect:  {Affect (PAA):22687}  Thought Content: {Thought Content:22690}   Suicidal Thoughts:  {ST/HT (PAA):22692}  Homicidal Thoughts:  {ST/HT (PAA):22692}  Thought Process:  {Thought Process (PAA):22688}  Orientation:  {BHH ORIENTATION (PAA):22689}    Memory:  {BHH MEMORY:22881}  Judgment:  {Judgement (PAA):22694}  Insight:  {Insight (PAA):22695}  Concentration:  {Concentration:21399}  Recall:  {BHH GOOD/FAIR/POOR:22877}  Fund of Knowledge: {BHH GOOD/FAIR/POOR:22877}  Language: {BHH GOOD/FAIR/POOR:22877}  Psychomotor Activity:  {Psychomotor (PAA):22696}  Akathisia:  {BHH YES OR NO:22294}  AIMS (if indicated): {Desc; done/not:10129}  Assets:  {Assets (PAA):22698}  ADL's:  {BHH ZOX'W:96045}  Cognition: {chl bhh cognition:304700322}  Sleep:  {BHH GOOD/FAIR/POOR:22877}   PE: General: well-appearing; no acute distress *** Pulm: no increased work of breathing on room air *** Strength & Muscle Tone: {desc; muscle tone:32375} Neuro: no focal neurological deficits observed *** Gait & Station: {PE GAIT ED WUJW:11914}  Metabolic Disorder Labs: No results found for: "HGBA1C", "MPG" No results found for: "PROLACTIN" Lab Results  Component Value Date   CHOL 200 (H) 11/18/2021   TRIG 65 11/18/2021   HDL 80 11/18/2021   CHOLHDL 2.5 11/18/2021   LDLCALC 108 (H) 11/18/2021   LDLCALC 109 (H) 12/20/2020   Lab Results  Component Value Date   TSH 0.114 (L)  05/02/2019    Therapeutic Level Labs: No results found for: "LITHIUM" No results found for: "CBMZ" Lab Results  Component Value Date   VALPROATE 25 (L) 03/04/2016    Screenings:  [place] Office Visit from 01/24/2022 in [place] Office Visit from 10/09/2021 in [place] Office Visit from 07/18/2021 in [place] Office Visit from 12/20/2020 in [place] Office Visit from 03/16/2020 in [place]  PHQ-2 Total Score 0 0 0 1 2  PHQ-9 Total Score 10 0 0 8 17       Collaboration of Care: Collaboration of Care: [facility] OP Collaboration of Care:21014065}  Patient/Guardian was advised Release of Information must be obtained prior to any record release in order to collaborate their care with an outside provider. Patient/Guardian was advised if they have not already done so to contact the registration department to sign all necessary forms in order for [place] to release  information regarding their care.   Consent: Patient/Guardian gives verbal consent for treatment and assignment of benefits for services provided during this visit. Patient/Guardian expressed understanding and agreed to proceed.   A total of *** minutes was spent involved in face to face clinical care, chart review, documentation, and ***.   [place] A  11/1/20239:24 PM

## 2022-02-06 ENCOUNTER — Ambulatory Visit (HOSPITAL_COMMUNITY): Payer: Medicaid Other | Admitting: Psychiatry

## 2022-02-11 ENCOUNTER — Other Ambulatory Visit (HOSPITAL_COMMUNITY): Payer: Self-pay

## 2022-02-11 ENCOUNTER — Ambulatory Visit (HOSPITAL_COMMUNITY): Payer: Medicaid Other | Admitting: Mental Health

## 2022-02-26 ENCOUNTER — Other Ambulatory Visit: Payer: Self-pay | Admitting: Student

## 2022-05-09 ENCOUNTER — Ambulatory Visit: Payer: Medicaid Other | Admitting: Student

## 2022-05-13 ENCOUNTER — Ambulatory Visit: Payer: Medicaid Other | Admitting: Student

## 2022-05-14 ENCOUNTER — Ambulatory Visit: Payer: Medicaid Other | Admitting: Student

## 2022-05-23 ENCOUNTER — Ambulatory Visit (INDEPENDENT_AMBULATORY_CARE_PROVIDER_SITE_OTHER): Payer: Medicaid Other | Admitting: Student

## 2022-05-23 ENCOUNTER — Encounter: Payer: Self-pay | Admitting: Student

## 2022-05-23 ENCOUNTER — Other Ambulatory Visit: Payer: Self-pay

## 2022-05-23 VITALS — BP 126/79 | HR 78 | Ht 64.0 in | Wt 121.2 lb

## 2022-05-23 DIAGNOSIS — E039 Hypothyroidism, unspecified: Secondary | ICD-10-CM

## 2022-05-23 DIAGNOSIS — F329 Major depressive disorder, single episode, unspecified: Secondary | ICD-10-CM

## 2022-05-23 DIAGNOSIS — F4310 Post-traumatic stress disorder, unspecified: Secondary | ICD-10-CM

## 2022-05-23 DIAGNOSIS — C439 Malignant melanoma of skin, unspecified: Secondary | ICD-10-CM | POA: Diagnosis not present

## 2022-05-23 DIAGNOSIS — F319 Bipolar disorder, unspecified: Secondary | ICD-10-CM

## 2022-05-23 DIAGNOSIS — E785 Hyperlipidemia, unspecified: Secondary | ICD-10-CM

## 2022-05-23 MED ORDER — PREGABALIN 25 MG PO CAPS: 25.0000 mg | ORAL_CAPSULE | Freq: Two times a day (BID) | ORAL | 0 refills | Status: DC | PRN

## 2022-05-23 MED ORDER — VIMPAT 100 MG PO TABS: 100.0000 mg | ORAL_TABLET | Freq: Two times a day (BID) | ORAL | 1 refills | Status: DC

## 2022-05-23 MED ORDER — ATORVASTATIN CALCIUM 40 MG PO TABS: 40.0000 mg | ORAL_TABLET | Freq: Every day | ORAL | 3 refills | Status: DC

## 2022-05-23 MED ORDER — LEVOTHYROXINE SODIUM 100 MCG PO TABS: 100.0000 ug | ORAL_TABLET | ORAL | 1 refills | Status: DC

## 2022-05-23 MED ORDER — LEVETIRACETAM 1000 MG PO TABS: ORAL_TABLET | ORAL | 1 refills | Status: DC

## 2022-05-23 MED ORDER — DULOXETINE HCL 60 MG PO CPEP: 60.0000 mg | ORAL_CAPSULE | Freq: Every day | ORAL | 1 refills | Status: DC

## 2022-05-23 NOTE — Patient Instructions (Signed)
It was great to see you today!   Today we addressed: Your pain from the metastatic cancer, I have refilled your medications. You will need to follow up with Neurology outpatient. Please be on the look out for a call in 1-2 weeks.   You should return to our clinic Return in about 6 months (around 11/21/2022).  Please arrive 15 minutes before your appointment to ensure smooth check in process.    Please call the clinic at [phone] if your symptoms worsen or you have any concerns.  Thank you for allowing me to participate in your care, Dr. Darci Current [facility]

## 2022-05-23 NOTE — Progress Notes (Signed)
    SUBJECTIVE:   CHIEF COMPLAINT / HPI:   Yolanda Krueger is a 57 y.o. female  presenting for follow up of chronic conditions and medication refill.   Metastatic Melanoma  Hx of Epilepsy: per patient is in remission but has not had follow up with a dermatologist or neurologist due to financial constraints. She has not had her seizure medication for over one month due to being unable to afford her medications. Was previously referred to neurology and dermatology but did not answer the phone when they called to schedule an appointment due to being afraid of spam callers.   Fibromyalgia  Surgical Site pain: requesting a refill of her Lyrica to help manage her nerve pain. It has been bothering her at night and during the daily with electric shocks.   MDD  PTSD: has been out of her medications, requesting a refill   PERTINENT  PMH / PSH: Hx status epilepticus, hx metastatic melanoma to the brain, hypothyroidism, PTSD, MDD, weight loss  OBJECTIVE:   BP 126/79   Pulse 78   Ht 5' 4"$  (1.626 m)   Wt 121 lb 3.2 oz (55 kg)   LMP  (LMP Unknown)   SpO2 100%   BMI 20.80 kg/m   Chronically ill-appearing, no acute distress, disheveled Cardio: Regular rate, regular rhythm, no murmurs on exam. Pulm: Clear, no wheezing, no crackles. No increased work of breathing Abdominal: bowel sounds present, soft, non-tender, non-distended Extremities: no peripheral edema    ASSESSMENT/PLAN:   Metastatic melanoma ([place]) - refilled seizure medications  - refilled Lyrica for pain  - sent in referral to Neurology for follow up   Bipolar 1 disorder ([place]) - refilled medications  - may need referral to psychiatry if she does not improve once medications are restarted      Darci Current, [place]

## 2022-05-23 NOTE — Assessment & Plan Note (Signed)
-   refilled seizure medications  - refilled Lyrica for pain  - sent in referral to Neurology for follow up

## 2022-05-23 NOTE — Assessment & Plan Note (Addendum)
-   refilled medications  - may need referral to psychiatry if she does not improve once medications are restarted

## 2022-05-30 ENCOUNTER — Other Ambulatory Visit: Payer: Self-pay | Admitting: Student

## 2022-07-07 ENCOUNTER — Other Ambulatory Visit: Payer: Self-pay | Admitting: Student

## 2022-07-30 ENCOUNTER — Other Ambulatory Visit: Payer: Self-pay | Admitting: Student

## 2022-07-30 DIAGNOSIS — C439 Malignant melanoma of skin, unspecified: Secondary | ICD-10-CM

## 2022-08-04 ENCOUNTER — Other Ambulatory Visit: Payer: Self-pay | Admitting: Student

## 2022-08-13 ENCOUNTER — Ambulatory Visit: Payer: Medicaid Other | Admitting: Student

## 2022-08-21 ENCOUNTER — Ambulatory Visit: Payer: Medicaid Other | Admitting: Student

## 2022-08-28 ENCOUNTER — Other Ambulatory Visit: Payer: Self-pay | Admitting: Student

## 2022-08-28 DIAGNOSIS — C439 Malignant melanoma of skin, unspecified: Secondary | ICD-10-CM

## 2022-09-11 ENCOUNTER — Other Ambulatory Visit: Payer: Self-pay | Admitting: Student

## 2022-09-11 DIAGNOSIS — C439 Malignant melanoma of skin, unspecified: Secondary | ICD-10-CM

## 2022-10-01 ENCOUNTER — Other Ambulatory Visit: Payer: Self-pay | Admitting: Student

## 2022-10-01 DIAGNOSIS — C439 Malignant melanoma of skin, unspecified: Secondary | ICD-10-CM

## 2022-10-06 ENCOUNTER — Other Ambulatory Visit (HOSPITAL_COMMUNITY): Payer: Self-pay

## 2022-10-15 ENCOUNTER — Encounter: Payer: Self-pay | Admitting: Family Medicine

## 2022-10-15 ENCOUNTER — Ambulatory Visit (INDEPENDENT_AMBULATORY_CARE_PROVIDER_SITE_OTHER): Payer: Medicaid Other | Admitting: Family Medicine

## 2022-10-15 ENCOUNTER — Other Ambulatory Visit: Payer: Self-pay | Admitting: Student

## 2022-10-15 ENCOUNTER — Other Ambulatory Visit (HOSPITAL_COMMUNITY): Payer: Self-pay

## 2022-10-15 ENCOUNTER — Other Ambulatory Visit: Payer: Self-pay

## 2022-10-15 VITALS — BP 153/88 | HR 75 | Ht 64.0 in | Wt 119.4 lb

## 2022-10-15 DIAGNOSIS — A539 Syphilis, unspecified: Secondary | ICD-10-CM | POA: Diagnosis not present

## 2022-10-15 DIAGNOSIS — C439 Malignant melanoma of skin, unspecified: Secondary | ICD-10-CM

## 2022-10-15 DIAGNOSIS — J069 Acute upper respiratory infection, unspecified: Secondary | ICD-10-CM | POA: Diagnosis not present

## 2022-10-15 DIAGNOSIS — Z202 Contact with and (suspected) exposure to infections with a predominantly sexual mode of transmission: Secondary | ICD-10-CM | POA: Insufficient documentation

## 2022-10-15 LAB — POC SOFIA SARS ANTIGEN FIA: SARS Coronavirus 2 Ag: NEGATIVE

## 2022-10-15 MED ORDER — PREGABALIN 25 MG PO CAPS
25.0000 mg | ORAL_CAPSULE | Freq: Two times a day (BID) | ORAL | 0 refills | Status: DC | PRN
Start: 2022-10-15 — End: 2023-03-27
  Filled 2022-10-15: qty 60, 30d supply, fill #0

## 2022-10-15 MED ORDER — PREGABALIN 25 MG PO CAPS
25.0000 mg | ORAL_CAPSULE | Freq: Two times a day (BID) | ORAL | 0 refills | Status: DC | PRN
Start: 2022-10-15 — End: 2022-10-15
  Filled 2022-10-15: qty 60, 30d supply, fill #0

## 2022-10-15 NOTE — Assessment & Plan Note (Addendum)
Patient requested Lyrica refill for fibromyalgia and nervous system pain in setting of past diagnoses.  Upon review of PDMP, patient had not filled medicine since February, when medication was prescribed during chronic condition follow-up visit. - Refilled pregabalin 25 mg for 1 month - Later spoke with PCP, who noted the patient does not have myalgia diagnosis and would like patient to discontinue pregabalin

## 2022-10-15 NOTE — Progress Notes (Cosign Needed Addendum)
SUBJECTIVE:   CHIEF COMPLAINT / HPI:  Yolanda Krueger is a 57 y.o. female with a past medical history of metastatic melanoma with brain metastases, hypothyroid on Synthroid, HLD, MDD, BPD1  presenting to the clinic for acute respiratory illness.  Respiratory illness Patient went to [facility] at the end of June with her boyfriend and stayed in a low quality motel on the way back.  There were cockroaches and there was no AC.  Shortly after the stay, the patient got a nonproductive cough, nasal congestion, and her ears began to hurt.  Patient denies fever, chills, N/V/D.  Does endorse arthralgias and fatigue.  Has been having only mild shortness of breath.  Cough is persistent. Patient has been using various products in his ears including hydrogen peroxide.  He has also taken TheraFlu.  Nothing has helped her symptoms. Her boyfriend is also here today sick with similar symptoms.  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  Patient completed treatment course of penicillin 8 months ago and is not having any symptoms. Has only been sexually active with same partner who also received adequate syphilis treatment. Unfortunately, patient left before test of cure could be completed.  PERTINENT PMH / PSH: Metastatic melanoma with brain metastases, hypothyroid on Synthroid, HLD, MDD, BPD1   OBJECTIVE:   BP (!) 153/88   Pulse 75   Ht 5\' 4"  (1.626 m)   Wt 119 lb 6.4 oz (54.2 kg)   LMP  (LMP Unknown)   SpO2 100%   BMI 20.49 kg/m   General: Thin female, appears older than stated age. Resting comfortably in chair, NAD, alert and at baseline. HEENT: Head: Normocephalic, atraumatic. No tenderness to percussion over sinuses. Eyes: PERRLA. No conjunctival erythema or scleral injections. Ears: TMs non-bulging and non-erythematous bilaterally.  Mild erythema of the external ear canal without erosions.  No cerumen impaction. Nose: Mildly boggy and erythematous  turbinates. Mouth/Oral: Clear, no tonsillar exudate.  Mild oropharyngeal erythema.  Dry mucous membranes. Neck: Supple. No LAD. Cardiovascular: Regular rate and rhythm. Normal S1/S2. No murmurs, rubs, or gallops appreciated. 2+ radial pulses. Pulmonary: Clear bilaterally to ascultation. No increased WOB, no accessory muscle usage. No wheezes, or crackles.  Referred upper airway sounds audible. Abdominal: No tenderness to deep or light palpation. No rebound or guarding. No HSM. Skin: Warm and dry.  No rashes grossly. Extremities: No peripheral edema bilaterally.  Capillary refill ~3 seconds.  POCT Labs Results for orders placed or performed in visit on 10/15/22 (from the past 48 hour(s))  POC SOFIA Antigen FIA     Status: None   Collection Time: 10/15/22 10:10 AM  Result Value Ref Range   SARS Coronavirus 2 Ag Negative Negative    ASSESSMENT/PLAN:   Metastatic melanoma (H[facility]) Patient requested Lyrica refill for fibromyalgia and nervous system pain in setting of past diagnoses.  Upon review of PDMP, patient had not filled medicine since February, when medication was prescribed during chronic condition follow-up visit. - Refilled pregabalin 25 mg for 1 month - Later spoke with PCP, who noted the patient does not have myalgia diagnosis and would like patient to discontinue pregabalin  Syphilis contact, treated Recommendation is for test of cure 6 months after treatment.  Unfortunately, patient left prior to lab order placement.  Will call patient back for a lab only visit. - RPR future order placed  Viral URI Patient is illness is consistent with viral URI.  No concern for pneumonia or sinusitis based on physical exam and  presentation.  Patient continues comfortable, but overall not sick and he does not require urgent intervention.  Does appear to be mildly dehydrated and has been using product such as hydrogen peroxide in the ears, likely causing irritation. - Provided guidance and  supportive care symptom management, including honey for cough and sore throat, voice to moisturize nasal passages, and points of hydration - Educated patient on difference between viral and bacterial infections and explained ineffectiveness of antibiotics in these circumstances - Provided strict ED precautions if patient becomes significantly short of breath, febrile over 104 Fahrenheit, or has significant change in status - COVID rapid test negative, respiratory pathogen panel pending  Patient to follow up with PCP in 1-2 weeks concerning left neck and shoulder pain.  Yolanda Hackley Sharion Dove, MD [facility]

## 2022-10-15 NOTE — Telephone Encounter (Signed)
Will forward to morning preceptor to fill.   Yolanda Krueger is not enrolled with Medicaid as of yet.

## 2022-10-15 NOTE — Telephone Encounter (Signed)
Patient walked in stating that medicaid will not cover the prescription for Lyrica because of who wrote it.  Can another doctor write the prescription and send it to [facility] .  Patient ph # [phone]

## 2022-10-15 NOTE — Assessment & Plan Note (Signed)
Recommendation is for test of cure 6 months after treatment.  Unfortunately, patient left prior to lab order placement.  Will call patient back for a lab only visit. - RPR future order placed

## 2022-10-15 NOTE — Patient Instructions (Addendum)
It was great to see you today! Thank you for choosing [facility] for your primary care.  Today we addressed: I believe you have a virus infection.  Antibiotics will not help because they only fight bacteria infections.  We need to control your symptoms. You can inhale steam from a safe distance over a boiling pot of water to moisturize your nose.  Do not lean too close to the pot, or you will get burned.  You can use honey to soothe your throat and cough. Please do NOT put anything in your ears.  They will get better with time as your nose unclogs. I refilled your Lyrica. Please use it carefully.  We are checking some labs today, including a virus panel.  I will call you if it is positive.  You should go to the emergency department if you cannot breathe, are running a persistent fever, or start feeling significantly worse.  Please come back soon to follow up with your PCP about your neck and shoulder pain.  Thank you for coming to see [place] at [facility] and for the opportunity to care for you! Lucan Riner, MD 10/15/2022, 10:45 AM  ____________________________________________________  Make sure to check out at the front desk before you leave today.  Please arrive at least 15 minutes prior to your scheduled appointments.  If you haven't already, please set up MyChart to have easy access to your labs results, and communication with your primary care physician.

## 2022-10-16 ENCOUNTER — Other Ambulatory Visit (HOSPITAL_COMMUNITY): Payer: Self-pay

## 2022-10-17 ENCOUNTER — Other Ambulatory Visit (HOSPITAL_COMMUNITY): Payer: Self-pay

## 2022-10-17 LAB — RESPIRATORY PANEL W/ SARS-COV2

## 2022-10-26 ENCOUNTER — Other Ambulatory Visit: Payer: Self-pay | Admitting: Student

## 2022-10-26 DIAGNOSIS — C439 Malignant melanoma of skin, unspecified: Secondary | ICD-10-CM

## 2022-10-27 NOTE — Telephone Encounter (Signed)
Pt needs to see Neurology for larger script.

## 2022-11-03 ENCOUNTER — Ambulatory Visit: Payer: Medicaid Other | Admitting: Student

## 2022-11-03 NOTE — Progress Notes (Deleted)
  SUBJECTIVE:   CHIEF COMPLAINT / HPI:   Left neck/shoulder pain:  PERTINENT  PMH / PSH: Metastatic melanoma, bipolar 1 disorder, hypothyroidism, HLD  Patient Care Team: Shelby Mattocks, DO as PCP - General (Family Medicine) OBJECTIVE:  LMP  (LMP Unknown)  Physical Exam   ASSESSMENT/PLAN:  There are no diagnoses linked to this encounter. No follow-ups on file. Shelby Mattocks, DO 11/03/2022, 8:04 AM PGY-***, [facility] {    This will disappear when note is signed, click to select method of visit    :1}

## 2022-12-01 ENCOUNTER — Other Ambulatory Visit: Payer: Self-pay | Admitting: Family Medicine

## 2022-12-01 ENCOUNTER — Other Ambulatory Visit (HOSPITAL_COMMUNITY): Payer: Self-pay

## 2022-12-01 DIAGNOSIS — C439 Malignant melanoma of skin, unspecified: Secondary | ICD-10-CM

## 2023-01-12 ENCOUNTER — Ambulatory Visit: Payer: Medicaid Other | Admitting: Student

## 2023-01-19 ENCOUNTER — Other Ambulatory Visit (HOSPITAL_COMMUNITY): Payer: Self-pay

## 2023-02-13 ENCOUNTER — Encounter: Payer: Self-pay | Admitting: Family Medicine

## 2023-02-13 ENCOUNTER — Ambulatory Visit (INDEPENDENT_AMBULATORY_CARE_PROVIDER_SITE_OTHER): Payer: Medicaid Other | Admitting: Family Medicine

## 2023-02-13 VITALS — BP 128/86 | Ht 64.0 in | Wt 120.0 lb

## 2023-02-13 DIAGNOSIS — M25512 Pain in left shoulder: Secondary | ICD-10-CM | POA: Diagnosis not present

## 2023-02-13 DIAGNOSIS — M542 Cervicalgia: Secondary | ICD-10-CM | POA: Diagnosis not present

## 2023-02-13 MED ORDER — METHYLPREDNISOLONE ACETATE 40 MG/ML IJ SUSP
40.0000 mg | Freq: Once | INTRAMUSCULAR | Status: AC
Start: 2023-02-13 — End: 2023-02-13
  Administered 2023-02-13: 40 mg via INTRA_ARTICULAR

## 2023-02-13 NOTE — Progress Notes (Signed)
  Yolanda Krueger - 57 y.o. female MRN [id number]  Date of birth: 1965/08/18    SUBJECTIVE:      Chief Complaint:/ HPI:  Chronic left neck and posterior left shoulder pain.  Limited shoulder and neck mobility after multiple surgeries.  Cannot use that shoulder as well as the right.  Has had chronic pain in the posterior shoulder and left neck for several years now.  Has not tried physical therapy or injection therapy.  Has used over-the-counter Advil with limited improvement.  Has also tried various opioid medications without much success.  Would like to address for the musculoskeletal point.   PERTINENT  PMH / PSH: I have reviewed the patient's medications, allergies, past medical and surgical history, smoking status.  Pertinent findings that relate to today's visit / issues include: History of craniotomy for excision of malignant melanoma metastasis to brain.  2019. Remote history of resection left sternocleidomastoid muscle  OBJECTIVE: BP 128/86   Ht 5\' 4"  (1.626 m)   Wt 120 lb (54.4 kg)   LMP  (LMP Unknown)   BMI 20.60 kg/m   Physical Exam:  Vital signs are reviewed. GENERAL: Thin female, ill-appearing. SHOULDER: Right shoulder normal in appearance.  Left shoulder is contracted with upper arm held in abduction although she cannot AD duct to about 45 degrees.  Cachectic muscular around the left posterior neck and shoulder.  Very tender over the trigger points of th insertion of the levator scapula.  Trigger point tenderness in the left trapezius. Neck: Slight rotational spasm 10 degrees laterally left. PROCEDURE: INJECTION: Patient was given informed consent, signed copy in the chart. Appropriate time out was taken. Area prepped and draped in usual sterile fashion. Ethyl chloride was  used for local anesthesia. A 21 gauge 1 1/2 inch needle was used..  1 cc of methylprednisolone 40 mg/ml plus 1 cc of 1% lidocaine without epinephrine was injected into the 3 trigger point areas of the posterior  neck, left trapezius and insertion of the levator scapula using a(n) perpendicular approach.   The patient tolerated the procedure well. There were no complications. Post procedure instructions were given.   ASSESSMENT & PLAN: Chronic muscle spasm left neck and posterior shoulder.  Trigger point injection today.  Home exercise program.  Follow-up 4 weeks. See problem based charting & AVS for pt instructions. No problem-specific Assessment & Plan notes found for this encounter.

## 2023-03-18 ENCOUNTER — Other Ambulatory Visit (HOSPITAL_COMMUNITY): Payer: Self-pay

## 2023-03-18 ENCOUNTER — Other Ambulatory Visit: Payer: Self-pay

## 2023-03-20 ENCOUNTER — Ambulatory Visit (INDEPENDENT_AMBULATORY_CARE_PROVIDER_SITE_OTHER): Payer: Medicaid Other | Admitting: Family Medicine

## 2023-03-20 ENCOUNTER — Ambulatory Visit: Payer: Medicaid Other | Admitting: Family Medicine

## 2023-03-20 ENCOUNTER — Other Ambulatory Visit (HOSPITAL_COMMUNITY): Payer: Self-pay

## 2023-03-20 ENCOUNTER — Encounter: Payer: Self-pay | Admitting: Family Medicine

## 2023-03-20 VITALS — BP 158/94 | Ht 64.0 in | Wt 125.0 lb

## 2023-03-20 VITALS — BP 130/64 | HR 61 | Ht 64.0 in | Wt 125.4 lb

## 2023-03-20 DIAGNOSIS — M5412 Radiculopathy, cervical region: Secondary | ICD-10-CM | POA: Diagnosis not present

## 2023-03-20 DIAGNOSIS — M542 Cervicalgia: Secondary | ICD-10-CM

## 2023-03-20 DIAGNOSIS — G40909 Epilepsy, unspecified, not intractable, without status epilepticus: Secondary | ICD-10-CM | POA: Diagnosis not present

## 2023-03-20 DIAGNOSIS — E785 Hyperlipidemia, unspecified: Secondary | ICD-10-CM

## 2023-03-20 DIAGNOSIS — C439 Malignant melanoma of skin, unspecified: Secondary | ICD-10-CM | POA: Diagnosis not present

## 2023-03-20 DIAGNOSIS — F329 Major depressive disorder, single episode, unspecified: Secondary | ICD-10-CM | POA: Diagnosis not present

## 2023-03-20 DIAGNOSIS — Z599 Problem related to housing and economic circumstances, unspecified: Secondary | ICD-10-CM

## 2023-03-20 DIAGNOSIS — Z23 Encounter for immunization: Secondary | ICD-10-CM

## 2023-03-20 DIAGNOSIS — E039 Hypothyroidism, unspecified: Secondary | ICD-10-CM | POA: Diagnosis not present

## 2023-03-20 DIAGNOSIS — F4310 Post-traumatic stress disorder, unspecified: Secondary | ICD-10-CM | POA: Diagnosis not present

## 2023-03-20 MED ORDER — METHYLPREDNISOLONE ACETATE 40 MG/ML IJ SUSP
40.0000 mg | Freq: Once | INTRAMUSCULAR | Status: AC
Start: 2023-03-20 — End: 2023-03-20
  Administered 2023-03-20: 40 mg via INTRA_ARTICULAR

## 2023-03-20 MED ORDER — DULOXETINE HCL 60 MG PO CPEP
60.0000 mg | ORAL_CAPSULE | Freq: Every day | ORAL | 1 refills | Status: DC
  Filled 2023-03-20: qty 90, 90d supply, fill #0
  Filled 2023-06-15 (×2): qty 90, 90d supply, fill #1

## 2023-03-20 MED ORDER — ATORVASTATIN CALCIUM 40 MG PO TABS
40.0000 mg | ORAL_TABLET | Freq: Every day | ORAL | 3 refills | Status: DC
  Filled 2023-03-20: qty 90, 90d supply, fill #0
  Filled 2023-06-15 (×2): qty 90, 90d supply, fill #1
  Filled 2023-09-21: qty 90, 90d supply, fill #2
  Filled 2024-01-14: qty 90, 90d supply, fill #3

## 2023-03-20 MED ORDER — LEVETIRACETAM 1000 MG PO TABS
1000.0000 mg | ORAL_TABLET | Freq: Two times a day (BID) | ORAL | 0 refills | Status: DC
  Filled 2023-03-20: qty 60, 30d supply, fill #0

## 2023-03-20 MED ORDER — LEVOTHYROXINE SODIUM 100 MCG PO TABS
100.0000 ug | ORAL_TABLET | ORAL | 1 refills | Status: DC
  Filled 2023-03-20: qty 90, 90d supply, fill #0
  Filled 2023-06-15 (×2): qty 90, 90d supply, fill #1

## 2023-03-20 NOTE — Progress Notes (Signed)
    SUBJECTIVE:   CHIEF COMPLAINT / HPI:   Neck pain On left side.  Saw sports medicine for this on 02/13/2023 and diagnosed with chronic muscle spasm of the left neck and posterior shoulder.  Trigger point injection was performed at that time and she was given a home exercise program.  She has an appointment with them later today.  Medication checkup Has not been able to afford any of her medicines.  She was able to get the hold all her medications at the community pharmacy, though this still poses a barrier for her.  Because of this, she would rule out to get her cholesterol and her thyroid checked.  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, PTSD, MDD, bipolar 1, hyperlipidemia  OBJECTIVE:   BP 130/64   Pulse 61   Ht 5\' 4"  (1.626 m)   Wt 125 lb 6 oz (56.9 kg)   LMP  (LMP Unknown)   SpO2 100%   BMI 21.52 kg/m   General: Alert and oriented, in NAD Skin: Warm, dry, and intact HEENT: NCAT, EOM grossly normal, midline nasal septum Cardiac: RRR, no m/r/g appreciated Respiratory: CTAB, breathing and speaking comfortably on RA Abdominal: Soft, nontender, nondistended, normoactive bowel sounds Musculoskeletal: Left arm/shoulder: Tenderness to palpation over trapezius into neck and shoulder, full passive range of motion of the shoulder though with pain elicited with full abduction and left upper shoulder/neck, strength 4/5 due to pain compared to the right, pain elicited with external rotation against resistance, negative Hawkins Neurological: No gross focal deficit Psychiatric: Appropriate mood and affect  ASSESSMENT/PLAN:   Hypothyroid Unfortunately not able to obtain medication for a while now.  Recheck TSH today.  Will reach out to social work for medication assistance.  Hyperlipidemia Unfortunately has not been able to afford medications.  Will recheck lipid panel today.  Will reach out to social work for medication  assistance.  Cervical pain (neck) Continued problem.  Possibly some nerve component given she had lymph nodes removed surgically in that area and symptoms started after this.  Chronic muscle spasm would also be consistent.  Lyrica has helped in the past, though she has not followed up with her neurologist about this.  Will send a new referral for new neurologist here in [place].  Seizure disorder (HCC) Has history of seizure disorder but has not been taking Vimpat or Keppra.  No reported epileptic activity.  Will refill Keppra.  Referral placed to neurologist for further management of other seizure medications.   Health maintenance She received her flu vaccine and her first shingles vaccine at her local pharmacy.  She received her COVID-19 vaccine today without incident.  Follow-up with PCP for further care gaps.  She has follow-up with her eye doctor concerning her vision.  Janeal Holmes, MD [facility]

## 2023-03-20 NOTE — Patient Instructions (Signed)

## 2023-03-20 NOTE — Assessment & Plan Note (Signed)
Unfortunately not able to obtain medication for a while now.  Recheck TSH today.  Will reach out to social work for medication assistance.

## 2023-03-20 NOTE — Assessment & Plan Note (Addendum)
-   Given the patient's recurrent spasms and pain with severe interference with daily activity and new parasthesias in the left arm, we will get an MRI - Trigger points given today as below - We will follow up after MRI results. If indicated we can refer for surgery or injection therapy.   Trigger Point Injection of the left trapezius muscles The risks and benefits were discussed with the patient Consent obtained and verified. Time-out conducted. Noted no overlying erythema, induration, or other signs of local infection. The patient's skin was prepped using alcohol. Topical analgesic spray: Ethyl chloride Completed without difficulty and patient tolerated well.  I injected 3 trigger points along the left trapezius muscle at points of maximal spasm and tenderness. Meds: 1% Xylocaine a total of 1 cc with a total of Methylprednisone 1cc of 40mg /mL No immediate complications Advised to call if fevers/chills, erythema, induration, drainage, or persistent bleeding. Printed AVS given for post procedural care.

## 2023-03-20 NOTE — Assessment & Plan Note (Signed)
Unfortunately has not been able to afford medications.  Will recheck lipid panel today.  Will reach out to social work for medication assistance.

## 2023-03-20 NOTE — Patient Instructions (Addendum)
I will send in your medications for 2 weeks from now. I have sent a referral to neurology. I will get you connected with a Child psychotherapist. Be sure to follow up with the eye doctor and the sports medicine doctor. We will get labs today, and I will let you know results. You received your COVID vaccines today.

## 2023-03-20 NOTE — Progress Notes (Unsigned)
Yolanda Krueger - 57 y.o. female MRN [id number]  Date of birth: 08-May-1965  PCP: Yolanda Mattocks, DO  Subjective:  No chief complaint on file. Neck pain  HPI: Past Medical, Surgical, Social, and Family History Reviewed & Updated per EMR.   Patient is a 57 y.o. female here for follow up on cervical neck pain and spasm, last seen on 10/13/2022.  The trigger point ejections at last visit alleviated her pain greatly and allowed her increased range of motion.  Since then she has had progressive tightening of the neck with spasm and increased pain.  There is decreased range of motion on the left side and is now developed intermittent numbness and weakness of the left arm.  There has been no new trauma or surgeries but she does have a significant surgical history with intervention for malignant metastatic melanoma.  She denies any syncope, headaches, chest pain, or palpitations.  Past Medical History:  Diagnosis Date   Bipolar 1 disorder (HCC)    Cancer (HCC)    Hypothyroid    Malignant melanoma, metastatic (HCC)    PTSD (post-traumatic stress disorder)     Current Outpatient Medications on File Prior to Visit  Medication Sig Dispense Refill   Lacosamide (VIMPAT) 100 MG TABS TAKE 1 TABLET BY MOUTH IN THE MORNING AND AT BEDTIME (Patient not taking: Reported on 03/20/2023) 30 tablet 0   pregabalin (LYRICA) 25 MG capsule Take 1 capsule (25 mg total) by mouth 2 (two) times daily as needed. (Patient not taking: Reported on 03/20/2023) 60 capsule 0   No current facility-administered medications on file prior to visit.    Past Surgical History:  Procedure Laterality Date   MASTECTOMY Bilateral 2007   Date is approximated   MELANOMA EXCISION      Allergies  Allergen Reactions   Penicillins Rash        Objective:  Physical Exam: VS: BP:(!) 158/94  HR: bpm  TEMP: ( )  RESP:   HT:5\' 4"  (162.6 cm)   WT:125 lb (56.7 kg)  BMI:21.45  Gen: NAD, speaks clearly, comfortable in exam  room Respiratory: Normal respiratory effort on room air. No signs of distress Skin: No rashes, abrasions, or ecchymosis MSK:  Neck: Inspection there is substantial surgical scarring over the left anterior portion of the neck musculature.  The patient holds her head cocked to the left and a forward hanging of about 10 degrees, no JVD The scalenes and sternocleidomastoid on the left are obviously spasmed on inspection and palpation No palpable stepoffs. TTP over the C6-C7 processes and surrounding musculature Spurling's maneuver: Positive on the left ROM markedly restricted to 30 degrees of flexion, 15 degrees of extension, 10 degrees of left rotation, and minimal sidebending.  Grip strength and sensation normal in bilateral hands Strength good C4 to T1 distribution No sensory change to C4 to T1 Negative Hoffman sign bilaterally     Assessment & Plan:   Left cervical radiculopathy - Given the patient's recurrent spasms and pain with severe interference with daily activity and new parasthesias in the left arm, we will get an MRI - Trigger points given today as below - We will follow up after MRI results. If indicated we can refer for surgery or injection therapy.   Trigger Point Injection of the left trapezius muscles The risks and benefits were discussed with the patient Consent obtained and verified. Time-out conducted. Noted no overlying erythema, induration, or other signs of local infection. The patient's skin was prepped using alcohol. Topical analgesic  spray: Ethyl chloride Completed without difficulty and patient tolerated well.  I injected 3 trigger points along the left trapezius muscle at points of maximal spasm and tenderness. Meds: 1% Xylocaine a total of 1 cc with a total of Methylprednisone 1cc of 40mg /mL No immediate complications Advised to call if fevers/chills, erythema, induration, drainage, or persistent bleeding. Printed AVS given for post procedural care.       Rica Mote MD [facility] Sports Medicine Fellow

## 2023-03-20 NOTE — Assessment & Plan Note (Signed)
Has history of seizure disorder but has not been taking Vimpat or Keppra.  No reported epileptic activity.  Will refill Keppra.  Referral placed to neurologist for further management of other seizure medications.

## 2023-03-20 NOTE — Assessment & Plan Note (Signed)
Continued problem.  Possibly some nerve component given she had lymph nodes removed surgically in that area and symptoms started after this.  Chronic muscle spasm would also be consistent.  Lyrica has helped in the past, though she has not followed up with her neurologist about this.  Will send a new referral for new neurologist here in [place].

## 2023-03-21 LAB — LIPID PANEL
Chol/HDL Ratio: 1.8 {ratio} (ref 0.0–4.4)
Cholesterol, Total: 221 mg/dL — ABNORMAL HIGH (ref 100–199)
HDL: 123 mg/dL (ref >39)
LDL Chol Calc (NIH): 81 mg/dL (ref 0–99)
Triglycerides: 102 mg/dL (ref 0–149)
VLDL Cholesterol Cal: 17 mg/dL (ref 5–40)

## 2023-03-21 LAB — T4F: T4,Free (Direct): 0.1 ng/dL — ABNORMAL LOW (ref 0.82–1.77)

## 2023-03-21 LAB — TSH RFX ON ABNORMAL TO FREE T4: TSH: 196 u[IU]/mL — ABNORMAL HIGH (ref 0.450–4.500)

## 2023-03-23 ENCOUNTER — Encounter: Payer: Self-pay | Admitting: Family Medicine

## 2023-03-23 ENCOUNTER — Other Ambulatory Visit (HOSPITAL_COMMUNITY): Payer: Self-pay

## 2023-03-23 ENCOUNTER — Telehealth: Payer: Self-pay | Admitting: Family Medicine

## 2023-03-23 NOTE — Telephone Encounter (Signed)
Patient returns call to nurse line regarding missed call from Dr. Phineas Real.   Verified patient's name and DOB. Provided with message per Dr. Phineas Real.   Patient is already scheduled for follow up appointment on 05/04/23.  No further questions at this time.   Veronda Prude, RN

## 2023-03-23 NOTE — Telephone Encounter (Addendum)
Attempted to call patient to discuss lab results.  Call was sent to voicemail x 2; voicemail left to call back.  When patient calls back, please advise that her total cholesterol is elevated.  Her thyroid stimulating hormone levels are markedly elevated, as well.  I have sent a certified letter containing these results and instructing patient to call back.  When she calls back, please advise that I have already sent in a refill of her medications (including her Synthroid and atorvastatin) at her recent office visit.  I have also contacted social work as well as our Associate Professor to see if we can help her afford her medications.  She should contact us if she does not hear from either of these personnel within the next week. I would also recommend she have an office visit in 6 weeks to recheck her thyroid function after being back on Synthroid.

## 2023-03-24 ENCOUNTER — Other Ambulatory Visit (HOSPITAL_COMMUNITY): Payer: Self-pay

## 2023-03-25 ENCOUNTER — Telehealth: Payer: Self-pay | Admitting: Family Medicine

## 2023-03-25 NOTE — Telephone Encounter (Addendum)
Attempted to call patient x 2 to discuss using a charge account and her pharmacy to help obtain her medications and pay them off later.  When she calls back, please let her know this is an option.  ----- Message from Otho Najjar sent at 03/23/2023  2:45 PM EST ----- Patient has managed medicaid, which is why all her copays are $4. She does look to be using our outpatient pharmacies. She can ask them to place her copays on a charge account (AR account). This way she can be billed at a later date if needed. She will still be able to pickup her meds. ----- Message ----- From: Evette Georges, MD Sent: 03/23/2023  12:58 PM EST To: Otho Najjar, CPhT  Hi! This patient has difficulty affording multiple generic medications. She cannot afford the medications on the $4 list. I have sent referral to SW to help, but I was not sure if you have other resources? THANK YOU!

## 2023-03-27 ENCOUNTER — Other Ambulatory Visit (HOSPITAL_COMMUNITY): Payer: Self-pay

## 2023-03-27 ENCOUNTER — Telehealth: Payer: Self-pay | Admitting: Pharmacist

## 2023-03-27 DIAGNOSIS — C439 Malignant melanoma of skin, unspecified: Secondary | ICD-10-CM

## 2023-03-27 MED ORDER — LACOSAMIDE 100 MG PO TABS: 100.0000 mg | ORAL_TABLET | Freq: Two times a day (BID) | ORAL | 1 refills | Status: DC

## 2023-03-27 MED ORDER — PREGABALIN 25 MG PO CAPS: 25.0000 mg | ORAL_CAPSULE | Freq: Two times a day (BID) | ORAL | 1 refills | Status: DC | PRN

## 2023-03-27 MED ORDER — PREGABALIN 25 MG PO CAPS
25.0000 mg | ORAL_CAPSULE | Freq: Two times a day (BID) | ORAL | 1 refills | Status: DC | PRN
  Filled 2023-03-27: qty 60, 30d supply, fill #0
  Filled 2023-05-04: qty 60, 30d supply, fill #1

## 2023-03-27 MED ORDER — LACOSAMIDE 100 MG PO TABS
100.0000 mg | ORAL_TABLET | Freq: Two times a day (BID) | ORAL | 1 refills | Status: DC
  Filled 2023-03-27: qty 60, 30d supply, fill #0
  Filled 2023-05-04: qty 60, 30d supply, fill #1

## 2023-03-27 NOTE — Telephone Encounter (Signed)
Patient contacted for follow-up of medication need  Patient reports she has medicaid - recently purchased a few of her medication on an "RA account" BUT did not have pregabalin and did not have lacosamide  I asked her to send someone to the pharmacy - [facility] to pick-up a supply of these medications for short-term support.   New prescriptions for both sent to [facility] - with charge to [facility] Ind[facility] fund.   Total time with patient call and documentation of interaction: 12 minutes.    Contacted Pharmacy to clarify charge - [facility] Both prescription Medicaid charges to be handled by this fund.  Total $8  Discussed with Dr. Pollie Meyer - preceptor of the session.   Unsure if MAP available for this patient's meds - Durward Mallard can you investigate - Medications are all on Medicaid PDL.

## 2023-03-27 NOTE — Telephone Encounter (Signed)
Reviewed and agree with Dr Koval's plan.   

## 2023-03-27 NOTE — Addendum Note (Signed)
Addended by: Acquanetta Belling D on: 03/27/2023 04:45 PM   Modules accepted: Orders

## 2023-03-30 ENCOUNTER — Other Ambulatory Visit: Payer: Self-pay

## 2023-03-30 ENCOUNTER — Other Ambulatory Visit (HOSPITAL_COMMUNITY): Payer: Self-pay

## 2023-04-03 ENCOUNTER — Encounter: Payer: Self-pay | Admitting: Neurology

## 2023-04-14 ENCOUNTER — Other Ambulatory Visit: Payer: Self-pay | Admitting: Student

## 2023-04-14 ENCOUNTER — Other Ambulatory Visit (HOSPITAL_COMMUNITY): Payer: Self-pay

## 2023-04-14 ENCOUNTER — Other Ambulatory Visit: Payer: Self-pay | Admitting: Family Medicine

## 2023-04-14 DIAGNOSIS — C439 Malignant melanoma of skin, unspecified: Secondary | ICD-10-CM

## 2023-04-14 MED ORDER — LEVETIRACETAM 1000 MG PO TABS
1000.0000 mg | ORAL_TABLET | Freq: Two times a day (BID) | ORAL | 0 refills | Status: DC
  Filled 2023-04-14: qty 60, 30d supply, fill #0

## 2023-04-21 ENCOUNTER — Other Ambulatory Visit (HOSPITAL_COMMUNITY): Payer: Self-pay

## 2023-05-04 ENCOUNTER — Ambulatory Visit: Payer: Medicaid Other | Admitting: Family Medicine

## 2023-05-04 ENCOUNTER — Other Ambulatory Visit (HOSPITAL_COMMUNITY): Payer: Self-pay

## 2023-05-04 ENCOUNTER — Other Ambulatory Visit: Payer: Self-pay

## 2023-05-05 ENCOUNTER — Other Ambulatory Visit (HOSPITAL_COMMUNITY): Payer: Self-pay

## 2023-05-15 ENCOUNTER — Ambulatory Visit: Payer: Medicaid Other | Admitting: Neurology

## 2023-05-15 ENCOUNTER — Encounter: Payer: Self-pay | Admitting: Neurology

## 2023-05-20 ENCOUNTER — Other Ambulatory Visit (HOSPITAL_COMMUNITY): Payer: Self-pay

## 2023-05-20 ENCOUNTER — Other Ambulatory Visit: Payer: Self-pay | Admitting: Family Medicine

## 2023-05-20 DIAGNOSIS — C439 Malignant melanoma of skin, unspecified: Secondary | ICD-10-CM

## 2023-05-21 ENCOUNTER — Inpatient Hospital Stay: Admission: RE | Admit: 2023-05-21 | Payer: Medicaid Other | Source: Ambulatory Visit

## 2023-05-21 ENCOUNTER — Other Ambulatory Visit: Payer: Self-pay

## 2023-05-21 ENCOUNTER — Other Ambulatory Visit (HOSPITAL_COMMUNITY): Payer: Self-pay

## 2023-05-21 MED ORDER — LEVETIRACETAM 1000 MG PO TABS
1000.0000 mg | ORAL_TABLET | Freq: Two times a day (BID) | ORAL | 0 refills | Status: DC
  Filled 2023-05-21 – 2023-06-15 (×3): qty 60, 30d supply, fill #0

## 2023-05-27 ENCOUNTER — Other Ambulatory Visit (HOSPITAL_COMMUNITY): Payer: Self-pay

## 2023-05-27 MED ORDER — ABRYSVO 120 MCG/0.5ML IM SOLR
0.5000 mL | INTRAMUSCULAR | 0 refills | Status: DC
  Filled 2023-05-27: qty 0.5, 1d supply, fill #0

## 2023-06-02 ENCOUNTER — Other Ambulatory Visit (HOSPITAL_COMMUNITY): Payer: Self-pay

## 2023-06-08 ENCOUNTER — Other Ambulatory Visit (HOSPITAL_COMMUNITY): Payer: Self-pay

## 2023-06-08 ENCOUNTER — Encounter (HOSPITAL_COMMUNITY): Payer: Self-pay | Admitting: *Deleted

## 2023-06-08 ENCOUNTER — Ambulatory Visit (HOSPITAL_COMMUNITY)
Admission: EM | Admit: 2023-06-08 | Discharge: 2023-06-08 | Disposition: A | Discharge status: Home / Self Care | Attending: Family Medicine | Admitting: Family Medicine

## 2023-06-08 DIAGNOSIS — J069 Acute upper respiratory infection, unspecified: Secondary | ICD-10-CM | POA: Diagnosis not present

## 2023-06-08 LAB — POC COVID19/FLU A&B COMBO
Covid Antigen, POC: NEGATIVE
Influenza A Antigen, POC: NEGATIVE
Influenza B Antigen, POC: NEGATIVE

## 2023-06-08 MED ORDER — FLUTICASONE PROPIONATE 50 MCG/ACT NA SUSP
2.0000 | Freq: Every day | NASAL | 0 refills | Status: DC
  Filled 2023-06-08: qty 16, 30d supply, fill #0

## 2023-06-08 NOTE — ED Provider Notes (Signed)
 [facility]    CSN: [id number] Arrival date & time: 06/08/23  4098      History   Chief Complaint Chief Complaint  Patient presents with   Insomnia   Headache   Cough   Nasal Congestion   Shortness of Breath    HPI Yolanda Krueger is a 58 y.o. female.   Patient presenting with 1 week history of cough, headaches some mild shortness of breath as well as congestion.  Patient states that the cough is worse at night.  Patient notes that her roommate is also 6 and has similar symptoms.  Patient states that she has some shortness of breath sensation but is able to walk without worsening shortness of breath.   Insomnia Associated symptoms include headaches and shortness of breath.  Headache Associated symptoms: cough   Cough Associated symptoms: headaches and shortness of breath   Shortness of Breath Associated symptoms: cough and headaches     Past Medical History:  Diagnosis Date   Bipolar 1 disorder (HCC)    Cancer (HCC)    Hypothyroid    Malignant melanoma, metastatic (HCC)    PTSD (post-traumatic stress disorder)     Patient Active Problem List   Diagnosis Date Noted   Left cervical radiculopathy 03/20/2023   Seizure disorder (HCC) 03/20/2023   Syphilis contact, treated 10/15/2022   Encounter for screening colonoscopy 10/09/2021   Healthcare maintenance 12/20/2020   Weight loss 12/20/2020   Hyperlipidemia 03/16/2020   Major depressive disorder 02/25/2019   Nicotine use disorder 12/24/2018   Cervical pain (neck) 09/02/2018   Malignant neoplasm metastatic to brain [facility])    Cerebrovascular disease    Metastatic melanoma (HCC) 03/04/2016   Bipolar 1 disorder (HCC) 03/04/2016   Hypothyroid 03/04/2016   PTSD (post-traumatic stress disorder) 03/04/2016    Past Surgical History:  Procedure Laterality Date   MASTECTOMY Bilateral 2007   Date is approximated   MELANOMA EXCISION      OB History   No obstetric history on file.      Home  Medications    Prior to Admission medications   Medication Sig Start Date End Date Taking? Authorizing Provider  atorvastatin (LIPITOR) 40 MG tablet Take 1 tablet (40 mg total) by mouth daily. 03/20/23  Yes Mabe, Earvin Hansen, MD  DULoxetine (CYMBALTA) 60 MG capsule Take 1 capsule (60 mg total) by mouth daily. 03/20/23  Yes Mabe, Earvin Hansen, MD  fluticasone (FLONASE) 50 MCG/ACT nasal spray Place 2 sprays into both nostrils daily. 06/08/23  Yes Brenton Grills, MD  Lacosamide (VIMPAT) 100 MG TABS Take 1 tablet (100 mg total) by mouth 2 (two) times daily. 03/27/23  Yes McDiarmid, Leighton Roach, MD  levETIRAcetam (KEPPRA) 1000 MG tablet Take 1 tablet (1,000 mg total) by mouth 2 (two) times daily. *Please see Neurology* 05/21/23  Yes Pray, Milus Mallick, MD  levothyroxine (SYNTHROID) 100 MCG tablet Take 1 tablet (100 mcg total) by mouth every morning 30 minutes before food. 03/20/23  Yes Mabe, Earvin Hansen, MD  pregabalin (LYRICA) 25 MG capsule Take 1 capsule (25 mg total) by mouth 2 (two) times daily as needed. 03/27/23  Yes McDiarmid, Leighton Roach, MD    Family History Family History  Family history unknown: Yes    Social History Social History   Tobacco Use   Smoking status: Every Day    Types: Cigarettes   Smokeless tobacco: Never   Tobacco comments:     4 cigs a day. working on quitting  Vaping Use   Vaping status:  Never Used  Substance Use Topics   Alcohol use: No   Drug use: No    Types: Cocaine    Comment: denies     Allergies   Penicillins   Review of Systems Review of Systems  Respiratory:  Positive for cough and shortness of breath.   Neurological:  Positive for headaches.  Psychiatric/Behavioral:  The patient has insomnia.      Physical Exam Triage Vital Signs ED Triage Vitals  Encounter Vitals Group     BP 06/08/23 0901 120/70     Systolic BP Percentile --      Diastolic BP Percentile --      Pulse Rate 06/08/23 0901 78     Resp 06/08/23 0901 18     Temp 06/08/23 0901 98.2 F (36.8 C)      Temp Source 06/08/23 0901 Oral     SpO2 06/08/23 0901 94 %     Weight --      Height --      Head Circumference --      Peak Flow --      Pain Score 06/08/23 0900 8     Pain Loc --      Pain Education --      Exclude from Growth Chart --    No data found.  Updated Vital Signs BP 120/70 (BP Location: Left Arm)   Pulse 78   Temp 98.2 F (36.8 C) (Oral)   Resp 18   LMP  (LMP Unknown)   SpO2 94%   Visual Acuity Right Eye Distance:   Left Eye Distance:   Bilateral Distance:    Right Eye Near:   Left Eye Near:    Bilateral Near:     Physical Exam Constitutional:      Appearance: She is well-developed.  HENT:     Head: Normocephalic.     Mouth/Throat:     Mouth: Mucous membranes are moist.     Pharynx: Oropharynx is clear.  Cardiovascular:     Rate and Rhythm: Normal rate and regular rhythm.  Pulmonary:     Effort: Pulmonary effort is normal. No respiratory distress.     Breath sounds: Normal breath sounds. No decreased breath sounds or wheezing.  Neurological:     Mental Status: She is alert.      UC Treatments / Results  Labs (all labs ordered are listed, but only abnormal results are displayed) Labs Reviewed  POC COVID19/FLU A&B COMBO    EKG   Radiology No results found.  Procedures Procedures (including critical care time)  Medications Ordered in UC Medications - No data to display  Initial Impression / Assessment and Plan / UC Course  I have reviewed the triage vital signs and the nursing notes.  Pertinent labs & imaging results that were available during my care of the patient were reviewed by me and considered in my medical decision making (see chart for details).     Patient likely presenting with viral URI, given medical history will avoid any oral steroids at this time.  Will go ahead and do flu and COVID swab.  Did discuss with patient that she can use Flonase nightly 2 sprays each nostril as that will help with the postnasal drip as  well as the nighttime cough.  Also advised patient to use over-the-counter remedies and home remedies such as honey with tea.  Patient understanding and agreeable with plan. Final Clinical Impressions(s) / UC Diagnoses   Final diagnoses:  Viral upper  respiratory tract infection     Discharge Instructions      Please use your Flonase nightly with 2 sprays in each nostrils for the next 2 weeks.     ED Prescriptions     Medication Sig Dispense Auth. Provider   fluticasone (FLONASE) 50 MCG/ACT nasal spray Place 2 sprays into both nostrils daily. 1 g Brenton Grills, MD      PDMP not reviewed this encounter.   Brenton Grills, MD 06/08/23 [phone]

## 2023-06-08 NOTE — Discharge Instructions (Addendum)
 Please use your Flonase nightly with 2 sprays in each nostrils for the next 2 weeks.

## 2023-06-08 NOTE — ED Triage Notes (Signed)
 Pt states she has had cough, congestion, SOB, headache and insomnia X 1 week. She is taking Robitussin, mucinex, advil, aleve.

## 2023-06-15 ENCOUNTER — Other Ambulatory Visit (HOSPITAL_COMMUNITY): Payer: Self-pay

## 2023-06-15 ENCOUNTER — Other Ambulatory Visit: Payer: Self-pay

## 2023-06-15 ENCOUNTER — Other Ambulatory Visit: Payer: Self-pay | Admitting: Family Medicine

## 2023-06-15 DIAGNOSIS — C439 Malignant melanoma of skin, unspecified: Secondary | ICD-10-CM

## 2023-06-15 MED ORDER — PREGABALIN 25 MG PO CAPS
25.0000 mg | ORAL_CAPSULE | Freq: Two times a day (BID) | ORAL | 1 refills | Status: DC | PRN
  Filled 2023-06-15: qty 60, 30d supply, fill #0
  Filled 2023-07-14: qty 60, 30d supply, fill #1

## 2023-06-15 MED ORDER — LACOSAMIDE 100 MG PO TABS
100.0000 mg | ORAL_TABLET | Freq: Two times a day (BID) | ORAL | 1 refills | Status: DC
  Filled 2023-06-15: qty 60, 30d supply, fill #0
  Filled 2023-07-14: qty 60, 30d supply, fill #1

## 2023-07-06 NOTE — Telephone Encounter (Signed)
 Yolanda Krueger

## 2023-07-08 ENCOUNTER — Ambulatory Visit: Payer: Medicaid Other | Admitting: Neurology

## 2023-07-14 ENCOUNTER — Other Ambulatory Visit (HOSPITAL_COMMUNITY): Payer: Self-pay

## 2023-07-14 ENCOUNTER — Other Ambulatory Visit: Payer: Self-pay | Admitting: Family Medicine

## 2023-07-14 DIAGNOSIS — C439 Malignant melanoma of skin, unspecified: Secondary | ICD-10-CM

## 2023-07-14 MED ORDER — LEVETIRACETAM 1000 MG PO TABS
1000.0000 mg | ORAL_TABLET | Freq: Two times a day (BID) | ORAL | 0 refills | Status: DC
  Filled 2023-07-14: qty 60, 30d supply, fill #0

## 2023-08-10 ENCOUNTER — Other Ambulatory Visit (HOSPITAL_COMMUNITY): Payer: Self-pay

## 2023-08-10 ENCOUNTER — Other Ambulatory Visit: Payer: Self-pay | Admitting: Student

## 2023-08-10 DIAGNOSIS — C439 Malignant melanoma of skin, unspecified: Secondary | ICD-10-CM

## 2023-08-12 ENCOUNTER — Other Ambulatory Visit (HOSPITAL_COMMUNITY): Payer: Self-pay

## 2023-08-12 ENCOUNTER — Other Ambulatory Visit: Payer: Self-pay

## 2023-08-12 MED ORDER — LACOSAMIDE 100 MG PO TABS
100.0000 mg | ORAL_TABLET | Freq: Two times a day (BID) | ORAL | 1 refills | Status: DC
  Filled 2023-08-12: qty 60, 30d supply, fill #0
  Filled 2023-09-21: qty 60, 30d supply, fill #1

## 2023-08-12 MED ORDER — PREGABALIN 25 MG PO CAPS
25.0000 mg | ORAL_CAPSULE | Freq: Two times a day (BID) | ORAL | 1 refills | Status: DC | PRN
  Filled 2023-08-12: qty 60, 30d supply, fill #0
  Filled 2023-09-21: qty 60, 30d supply, fill #1

## 2023-08-12 MED ORDER — LEVETIRACETAM 1000 MG PO TABS
1000.0000 mg | ORAL_TABLET | Freq: Two times a day (BID) | ORAL | 0 refills | Status: AC
Start: 2023-08-12 — End: ?
  Filled 2023-08-12: qty 60, 30d supply, fill #0

## 2023-08-19 ENCOUNTER — Other Ambulatory Visit (HOSPITAL_COMMUNITY): Payer: Self-pay

## 2023-09-10 ENCOUNTER — Ambulatory Visit: Admitting: Neurology

## 2023-09-21 ENCOUNTER — Other Ambulatory Visit: Payer: Self-pay | Admitting: Family Medicine

## 2023-09-21 ENCOUNTER — Other Ambulatory Visit (HOSPITAL_COMMUNITY): Payer: Self-pay

## 2023-09-21 ENCOUNTER — Other Ambulatory Visit: Payer: Self-pay

## 2023-09-21 ENCOUNTER — Other Ambulatory Visit: Payer: Self-pay | Admitting: Student

## 2023-09-21 DIAGNOSIS — C439 Malignant melanoma of skin, unspecified: Secondary | ICD-10-CM

## 2023-09-21 DIAGNOSIS — M5412 Radiculopathy, cervical region: Secondary | ICD-10-CM

## 2023-09-21 DIAGNOSIS — E039 Hypothyroidism, unspecified: Secondary | ICD-10-CM

## 2023-09-21 DIAGNOSIS — F329 Major depressive disorder, single episode, unspecified: Secondary | ICD-10-CM

## 2023-09-21 DIAGNOSIS — F4310 Post-traumatic stress disorder, unspecified: Secondary | ICD-10-CM

## 2023-09-21 DIAGNOSIS — M542 Cervicalgia: Secondary | ICD-10-CM

## 2023-09-21 MED ORDER — LEVETIRACETAM 1000 MG PO TABS
1000.0000 mg | ORAL_TABLET | Freq: Two times a day (BID) | ORAL | 0 refills | Status: DC
  Filled 2023-09-21 – 2023-11-16 (×2): qty 60, 30d supply, fill #0

## 2023-09-21 MED ORDER — LEVOTHYROXINE SODIUM 100 MCG PO TABS
100.0000 ug | ORAL_TABLET | ORAL | 0 refills | Status: DC
  Filled 2023-09-21: qty 30, 30d supply, fill #0

## 2023-09-21 MED ORDER — DULOXETINE HCL 60 MG PO CPEP
60.0000 mg | ORAL_CAPSULE | Freq: Every day | ORAL | 0 refills | Status: DC
  Filled 2023-09-21 – 2023-11-16 (×2): qty 30, 30d supply, fill #0

## 2023-09-21 MED ORDER — FLUTICASONE PROPIONATE 50 MCG/ACT NA SUSP
2.0000 | Freq: Every day | NASAL | 0 refills | Status: DC
Start: 2023-09-21 — End: 2023-11-25
  Filled 2023-09-21: qty 16, 30d supply, fill #0

## 2023-09-21 NOTE — Progress Notes (Signed)
 Pt called requesting new MRI order since she missed her previous appt.

## 2023-09-22 ENCOUNTER — Telehealth: Payer: Self-pay

## 2023-09-22 ENCOUNTER — Ambulatory Visit (INDEPENDENT_AMBULATORY_CARE_PROVIDER_SITE_OTHER): Admitting: Student

## 2023-09-22 ENCOUNTER — Other Ambulatory Visit (HOSPITAL_COMMUNITY): Payer: Self-pay

## 2023-09-22 VITALS — BP 123/72 | HR 64 | Ht 64.0 in | Wt 118.0 lb

## 2023-09-22 DIAGNOSIS — C439 Malignant melanoma of skin, unspecified: Secondary | ICD-10-CM

## 2023-09-22 DIAGNOSIS — E039 Hypothyroidism, unspecified: Secondary | ICD-10-CM | POA: Diagnosis not present

## 2023-09-22 DIAGNOSIS — M542 Cervicalgia: Secondary | ICD-10-CM

## 2023-09-22 DIAGNOSIS — H9212 Otorrhea, left ear: Secondary | ICD-10-CM | POA: Diagnosis not present

## 2023-09-22 DIAGNOSIS — R519 Headache, unspecified: Secondary | ICD-10-CM

## 2023-09-22 MED ORDER — LEVOTHYROXINE SODIUM 100 MCG PO TABS: 100.0000 ug | ORAL_TABLET | ORAL | 3 refills | Status: DC

## 2023-09-22 NOTE — Progress Notes (Signed)
    SUBJECTIVE:   CHIEF COMPLAINT / HPI:   Left Ear Pain  Otorrhea  Decreased Hearing Patient with a history of metastatic melanoma with mets to both the left neck and brain.  She was treated with surgery and radiation in 2017-2018.  The last head or neck imaging that I have access to is an MRI of the brain from 2018 which showed a 8 mm cortical metastasis along the anterior right frontal lobe and a second, probably treated brain metastasis in the medial right frontal lobe.   She comes in today with left ear pain, decreased hearing, and clear otorrhea for about 2 to 3 weeks.  She initially thought that she might have an ear infection but now she is concerned due to increasing tenderness and fullness in the area around her left ear. She denies any fever, chills, vision changes, weakness, or disequilibrium.  OBJECTIVE:   BP 123/72   Pulse 64   Ht 5' 4 (1.626 m)   Wt 118 lb (53.5 kg)   LMP  (LMP Unknown)   SpO2 100%   BMI 20.25 kg/m   General: Well appearing and NAD HEENT: Left pinna, canal, and TM are unremarkable.  There is some fullness and tenderness posterior and inferior to the left ear. There is no overlying skin change. There is surgical scarring over the anterior left neck with tenderness to palpation along the scalene and SCM muscles.  Cardio: RRR, no murmur Pulm: Normal WOB on RA, lungs clear  Neuro: Left eye with lid lag, chronic. EOMs intact. No nystagmus. Cranial nerve testing WNL aside from diminished hearing on the left as above. Strength and sensation are intact throughout the BUE and BLE.   ASSESSMENT/PLAN:   Assessment & Plan Otorrhea of left ear Exam of the pinna, canal, tympanic membrane is benign.  However, there is local swelling and tenderness posterior and inferior to the left ear, though no obvious enhancement of findings over the mastoid to specifically suggest mastoiditis.  With her history of metastatic melanoma, there is obvious concern for invasive cancer  that warrants urgent imaging evaluation.  Discussed best imaging with Dr. Del Favia of radiology.  He suggested CT of the temporal bones. Will order with contrast to enhance soft tissue evaluation.  -CT temporal bones with contrast -CT soft tissue neck w contrast  -BMP to evaluate GFR prior to contrasted studies Hypothyroidism, unspecified type Tells me she needs a refill of her Synthroid , will plan to test TSH today and refill at an appropriate dose when results is available. -TSH     J Lark Plum, MD [facility]

## 2023-09-22 NOTE — Telephone Encounter (Signed)
 [facility] calls nurse line in regards to CT order.   She reports the order will need to be changed to either W or WO contrast.   Advised will send to ordering provider.

## 2023-09-22 NOTE — Patient Instructions (Addendum)
 Yolanda Krueger,  I think we need to look into this a bit more. I've ordered a CT scan of your head to evaluate the area around your ear. This will be done at [facility] at [address] They will call you to schedule this once it's been approved by your insurance. I am checking your thyroid  levels and then will refill your synthroid  at an appropriate dose tomorrow.   Alexa Andrews, MD

## 2023-09-23 ENCOUNTER — Ambulatory Visit: Payer: Self-pay | Admitting: Student

## 2023-09-23 ENCOUNTER — Other Ambulatory Visit: Payer: Self-pay

## 2023-09-23 ENCOUNTER — Other Ambulatory Visit (HOSPITAL_COMMUNITY): Payer: Self-pay

## 2023-09-23 LAB — BASIC METABOLIC PANEL WITH GFR
BUN/Creatinine Ratio: 24 — ABNORMAL HIGH (ref 9–23)
BUN: 15 mg/dL (ref 6–24)
CO2: 23 mmol/L (ref 20–29)
Calcium: 9.7 mg/dL (ref 8.7–10.2)
Chloride: 101 mmol/L (ref 96–106)
Creatinine, Ser: 0.62 mg/dL (ref 0.57–1.00)
Glucose: 81 mg/dL (ref 70–99)
Potassium: 4.3 mmol/L (ref 3.5–5.2)
Sodium: 139 mmol/L (ref 134–144)
eGFR: 103 mL/min/{1.73_m2} (ref >59)

## 2023-09-23 LAB — TSH: TSH: 12.2 u[IU]/mL — ABNORMAL HIGH (ref 0.450–4.500)

## 2023-09-23 MED ORDER — LEVOTHYROXINE SODIUM 125 MCG PO TABS
125.0000 ug | ORAL_TABLET | ORAL | 3 refills | Status: AC
  Filled 2023-09-23 – 2023-10-12 (×2): qty 90, 90d supply, fill #0
  Filled 2024-01-14: qty 90, 90d supply, fill #1
  Filled 2024-04-13: qty 90, 90d supply, fill #2

## 2023-09-23 NOTE — Assessment & Plan Note (Signed)
 Tells me she needs a refill of her Synthroid , will plan to test TSH today and refill at an appropriate dose when results is available. -TSH

## 2023-09-27 ENCOUNTER — Ambulatory Visit
Admission: RE | Admit: 2023-09-27 | Discharge: 2023-09-27 | Discharge status: Home / Self Care | Source: Ambulatory Visit | Attending: Family Medicine | Admitting: Family Medicine

## 2023-09-27 DIAGNOSIS — M542 Cervicalgia: Secondary | ICD-10-CM

## 2023-09-27 DIAGNOSIS — M5412 Radiculopathy, cervical region: Secondary | ICD-10-CM

## 2023-09-27 DIAGNOSIS — M4802 Spinal stenosis, cervical region: Secondary | ICD-10-CM | POA: Diagnosis not present

## 2023-09-27 DIAGNOSIS — M4722 Other spondylosis with radiculopathy, cervical region: Secondary | ICD-10-CM | POA: Diagnosis not present

## 2023-10-01 ENCOUNTER — Other Ambulatory Visit

## 2023-10-01 ENCOUNTER — Inpatient Hospital Stay: Admission: RE | Admit: 2023-10-01 | Source: Ambulatory Visit

## 2023-10-02 ENCOUNTER — Ambulatory Visit: Payer: Self-pay | Admitting: Family Medicine

## 2023-10-05 ENCOUNTER — Other Ambulatory Visit (HOSPITAL_COMMUNITY): Payer: Self-pay

## 2023-10-12 ENCOUNTER — Other Ambulatory Visit (HOSPITAL_COMMUNITY): Payer: Self-pay

## 2023-10-14 ENCOUNTER — Telehealth: Payer: Self-pay

## 2023-10-14 NOTE — Telephone Encounter (Signed)
 Patient calls inquiring about her upcoming CT scan appointment. Gave patient date and time of her CT appointment, but told patient she may want to reach out to the place doing it because they may want her to arrive earlier than appointment time. Patients friend/significant other, Charlie Lusher, is in the background talking over patient and begins repeating the same questions she just asked me. As soon as I begin speaking, the person in the background begins yelling at me stating you are very rude and act like you do not want to speak to anyone this morning. I then state that I wasn't aware he was asking me questions, I thought he was speaking to the patient as it sounds like he is far away from the phone and they keep speaking over each other. He then starts yelling let me speak to your supervisor and some other rude comments so I transferred him straight to my manager, Harlene Friar, phone to LVM.   Patient then calls right back but with Charlie Lusher being the main person on the line this time. He says who is the person I just spoke to on the phone who was being rude and acting like she did not want to speak to anybody this morning? All while the actual patient is in the background this time asking questions about her appointment coming up. The friend then yells at [facility] to shut up so he can hear.  I then politely tell them I need only one person to speak at a time in order for me to be the most helpful to them as when they both speak at once I don't know if it is me they are speaking to or each other. Phone call then went smoothly and got all patients and friends questions answered.   Documentation as an FYI and routing to [place] since she was also left a Engineer, technical sales.

## 2023-10-15 NOTE — Telephone Encounter (Signed)
 Called and spoke with Mr. Yolanda Krueger he expresses his apologies and goes no to explain that he has to take care of all of [place] appts and such and sometimes it is stressful  Advised that in the future to be respectful of staff and to only have one person on the phone at the time as to make the call as smooth as possible.  Pt agreeable and again apologetic.  Harlene Carte, CMA

## 2023-10-20 ENCOUNTER — Ambulatory Visit
Admission: RE | Admit: 2023-10-20 | Discharge: 2023-10-20 | Disposition: A | Discharge status: Home / Self Care | Source: Ambulatory Visit | Attending: Family Medicine | Admitting: Family Medicine

## 2023-10-20 ENCOUNTER — Other Ambulatory Visit

## 2023-10-20 DIAGNOSIS — R519 Headache, unspecified: Secondary | ICD-10-CM

## 2023-10-20 DIAGNOSIS — H748X2 Other specified disorders of left middle ear and mastoid: Secondary | ICD-10-CM | POA: Diagnosis not present

## 2023-10-20 DIAGNOSIS — Z9889 Other specified postprocedural states: Secondary | ICD-10-CM | POA: Diagnosis not present

## 2023-10-20 DIAGNOSIS — C439 Malignant melanoma of skin, unspecified: Secondary | ICD-10-CM

## 2023-10-20 DIAGNOSIS — H9212 Otorrhea, left ear: Secondary | ICD-10-CM

## 2023-10-20 MED ORDER — IOPAMIDOL (ISOVUE-370) INJECTION 76%
60.0000 mL | Freq: Once | INTRAVENOUS | Status: AC | PRN
  Administered 2023-10-20: 60 mL via INTRAVENOUS

## 2023-10-20 MED ORDER — IOPAMIDOL (ISOVUE-370) INJECTION 76%
75.0000 mL | Freq: Once | INTRAVENOUS | Status: AC | PRN
  Administered 2023-10-20: 75 mL via INTRAVENOUS

## 2023-10-23 ENCOUNTER — Other Ambulatory Visit (HOSPITAL_COMMUNITY): Payer: Self-pay

## 2023-10-23 ENCOUNTER — Ambulatory Visit (INDEPENDENT_AMBULATORY_CARE_PROVIDER_SITE_OTHER): Admitting: Family Medicine

## 2023-10-23 VITALS — BP 137/79 | Ht 64.0 in | Wt 118.0 lb

## 2023-10-23 DIAGNOSIS — M436 Torticollis: Secondary | ICD-10-CM

## 2023-10-23 DIAGNOSIS — C7800 Secondary malignant neoplasm of unspecified lung: Secondary | ICD-10-CM

## 2023-10-23 DIAGNOSIS — T66XXXS Radiation sickness, unspecified, sequela: Secondary | ICD-10-CM | POA: Diagnosis not present

## 2023-10-23 DIAGNOSIS — C439 Malignant melanoma of skin, unspecified: Secondary | ICD-10-CM

## 2023-10-23 DIAGNOSIS — G40909 Epilepsy, unspecified, not intractable, without status epilepticus: Secondary | ICD-10-CM

## 2023-10-23 DIAGNOSIS — H7092 Unspecified mastoiditis, left ear: Secondary | ICD-10-CM

## 2023-10-23 DIAGNOSIS — C7931 Secondary malignant neoplasm of brain: Secondary | ICD-10-CM | POA: Diagnosis not present

## 2023-10-23 MED ORDER — CEPHALEXIN 500 MG PO CAPS
500.0000 mg | ORAL_CAPSULE | Freq: Three times a day (TID) | ORAL | 0 refills | Status: DC
  Filled 2023-10-23: qty 90, 30d supply, fill #0

## 2023-10-23 NOTE — Patient Instructions (Signed)
 I have started you on antibiotics for a month. Let me see you in one month. Please call the office if you have any problems with the medicine or side effects or questions

## 2023-10-24 DIAGNOSIS — C799 Secondary malignant neoplasm of unspecified site: Secondary | ICD-10-CM | POA: Insufficient documentation

## 2023-10-24 DIAGNOSIS — H7092 Unspecified mastoiditis, left ear: Secondary | ICD-10-CM | POA: Insufficient documentation

## 2023-10-24 DIAGNOSIS — M436 Torticollis: Secondary | ICD-10-CM | POA: Insufficient documentation

## 2023-10-24 DIAGNOSIS — T66XXXA Radiation sickness, unspecified, initial encounter: Secondary | ICD-10-CM | POA: Insufficient documentation

## 2023-10-24 NOTE — Assessment & Plan Note (Signed)
 Concerned for mastoiditis as a result of radiation therapy. Will ask rad onc if they need to see her and refer if appropriate.

## 2023-10-24 NOTE — Progress Notes (Signed)
  Yolanda Krueger - 58 y.o. female MRN [id number]  Date of birth: Sep 25, 1965    SUBJECTIVE:     Ear pain/pain in area behind ear/decreased hearing LEFT Chief Complaint:/ HPI:   Had CT scan done and here for followup. 2. CSI I gace her at last OV seemed to help her muscular pain she was having last time I saw her  PERTINENT  PMH / PSH: I have reviewed the patient's medications, allergies, past medical and surgical history, smoking status.  Pertinent findings that relate to today's visit / issues include:  OV notes from Dr. Marlee 09/22/2023         The last head or neck imaging that I have access to is an MRI of the brain from 2018 which showed a 8 mm cortical metastasis along the anterior right frontal lobe and a second, probably treated brain metastasis in the medial right frontal lobe.   She comes in today with left ear pain, decreased hearing, and clear otorrhea for about 2 to 3 weeks.  She initially thought that she might have an ear infection but now she is concerned due to increasing tenderness and fullness in the area around her left ear.     CT scan 7/15/202  Temporal bone imaging  1. Some fluid opacification of the left mastoid air cells inferiorly. This could possibly be symptomatic. No evidence of bone destruction. No definite visible adjacent soft tissue inflammation. 2. Normal appearance of the right temporal bone. 3. Old right frontal craniotomy with right frontal encephalomalacia.  CT soft tissue imaging sequences   ...there is some sclerosis and or fluid opacification within the inferior left mastoid region. This could be late sequela of previous regional radiation, but the possibility of symptomatic mastoiditis is not excluded. .....  2.  Previous radical neck dissection on the left. No identifiable lymphadenopathy. No evidence of residual or recurrent tumor.   OBJECTIVE: BP 137/79   Ht 5' 4 (1.626 m)   Wt 118 lb (53.5 kg)   LMP  (LMP Unknown)   BMI 20.25 kg/m    Physical Exam:  Vital signs are reviewed. 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  No otorrhea is noted PSYCH: AxOx4. Good eye contact.. No psychomotor retardation or agitation. Appropriate speech fluency and content. Asks and answers questions appropriately. Mood is congruent.   ASSESSMENT & PLAN:  See problem based charting & AVS for pt instructions. Mastoiditis of left side Ideally will need ENT evaluation vs or possibly in addition to rad onc, but wait time for initial appt > 32month. Will start antibiotic coverage with cephalexin , planned 30 days tx. She has PCN allergy. Has previously been able to tolerate cephalexin  and that will give good staph coverage. She is non toxic, in fact has had these sx now for about a month, so I do not believe she needs IV abx. Will try to set yo ENT eval ASAP I will f/u in 3-4 wks, she will call if problems or worsening sx prior to that.  35 minutes total time spent including: patient interview, examination, review of chart and history, development of differential diagnoses, decision about testing, discussion of treatment and management options with patient and coordination of care.   Radiation therapy complication Concerned for mastoiditis as a result of radiation therapy. Will ask rad onc if they need to see her and refer if appropriate. Ref: Advertising account executive. 2014; 38(5): [id number]. [id number].

## 2023-10-24 NOTE — Assessment & Plan Note (Signed)
 Ideally will need ENT evaluation vs or possibly in addition to rad onc, but wait time for initial appt > 55month. Will start antibiotic coverage with cephalexin , planned 30 days tx. She has PCN allergy. Has previously been able to tolerate cephalexin  and that will give good staph coverage. She is non toxic, in fact has had these sx now for about a month, so I do not believe she needs IV abx. Will try to set yo ENT eval ASAP I will f/u in 3-4 wks, she will call if problems or worsening sx prior to that.  35 minutes total time spent including: patient interview, examination, review of chart and history, development of differential diagnoses, decision about testing, discussion of treatment and management options with patient and coordination of care.

## 2023-11-16 ENCOUNTER — Other Ambulatory Visit (HOSPITAL_COMMUNITY): Payer: Self-pay

## 2023-11-16 ENCOUNTER — Other Ambulatory Visit: Payer: Self-pay | Admitting: Student

## 2023-11-16 DIAGNOSIS — C439 Malignant melanoma of skin, unspecified: Secondary | ICD-10-CM

## 2023-11-18 ENCOUNTER — Other Ambulatory Visit (HOSPITAL_COMMUNITY): Payer: Self-pay

## 2023-11-18 MED ORDER — PREGABALIN 25 MG PO CAPS
25.0000 mg | ORAL_CAPSULE | Freq: Two times a day (BID) | ORAL | 0 refills | Status: DC | PRN
  Filled 2023-11-18 – 2023-11-19 (×3): qty 28, 14d supply, fill #0
  Filled ????-??-??: fill #0

## 2023-11-18 MED ORDER — LACOSAMIDE 100 MG PO TABS
100.0000 mg | ORAL_TABLET | Freq: Two times a day (BID) | ORAL | 0 refills | Status: DC
  Filled 2023-11-18 – 2023-11-19 (×2): qty 28, 14d supply, fill #0
  Filled ????-??-??: fill #0

## 2023-11-18 NOTE — Telephone Encounter (Signed)
 Had CMA team contact patient to verify she is taking these meds as clinical documentation has been mixed on whether she should be taking them. She is taking every day. Referral to neurology was previously placed for management of these medications. She has an appointment with neurology on 11/25/23. I will refill these medications for two weeks until she sees neurology and they determine further medication management for her neurologic concerns.

## 2023-11-18 NOTE — Telephone Encounter (Signed)
 Had CMA team contact patient to verify she is taking these meds as clinical documentation has been mixed on whether she should be taking them. She says she is taking every day; however, PDMP was reviewed and shows last fill in June for only 30 days. Referral to neurology was previously placed for management of these medications. She has an appointment with neurology on 11/25/23. I will refill these medications for two weeks until she sees neurology and they determine further medication management for her neurologic concerns.

## 2023-11-18 NOTE — Telephone Encounter (Signed)
 Spoke to patient and she is still taking both Lyrica  and Vimpat  on a daily basis.  Yolanda Krueger, CMA

## 2023-11-19 ENCOUNTER — Other Ambulatory Visit (HOSPITAL_COMMUNITY): Payer: Self-pay

## 2023-11-20 ENCOUNTER — Other Ambulatory Visit (HOSPITAL_COMMUNITY): Payer: Self-pay

## 2023-11-24 ENCOUNTER — Other Ambulatory Visit (HOSPITAL_COMMUNITY): Payer: Self-pay

## 2023-11-24 MED ORDER — PREDNISOLONE ACETATE 1 % OP SUSP
OPHTHALMIC | 2 refills | Status: AC
  Filled 2023-11-24: qty 5, 28d supply, fill #0

## 2023-11-24 MED ORDER — OFLOXACIN 0.3 % OP SOLN
1.0000 [drp] | Freq: Four times a day (QID) | OPHTHALMIC | 1 refills | Status: AC
  Filled 2023-11-24: qty 5, 25d supply, fill #0

## 2023-11-25 ENCOUNTER — Encounter: Payer: Self-pay | Admitting: Neurology

## 2023-11-25 ENCOUNTER — Ambulatory Visit: Admitting: Neurology

## 2023-11-25 ENCOUNTER — Other Ambulatory Visit (HOSPITAL_COMMUNITY): Payer: Self-pay

## 2023-11-25 VITALS — BP 134/76 | HR 70 | Ht 64.0 in | Wt 113.0 lb

## 2023-11-25 DIAGNOSIS — G40209 Localization-related (focal) (partial) symptomatic epilepsy and epileptic syndromes with complex partial seizures, not intractable, without status epilepticus: Secondary | ICD-10-CM

## 2023-11-25 DIAGNOSIS — C439 Malignant melanoma of skin, unspecified: Secondary | ICD-10-CM

## 2023-11-25 MED ORDER — PREGABALIN 75 MG PO CAPS
75.0000 mg | ORAL_CAPSULE | Freq: Three times a day (TID) | ORAL | 3 refills | Status: AC
  Filled 2023-11-25 – 2023-11-27 (×2): qty 90, 30d supply, fill #0
  Filled 2024-01-14: qty 90, 30d supply, fill #1
  Filled 2024-03-14: qty 90, 30d supply, fill #2
  Filled 2024-04-13: qty 90, 30d supply, fill #3

## 2023-11-25 MED ORDER — LEVETIRACETAM 1000 MG PO TABS
1000.0000 mg | ORAL_TABLET | Freq: Two times a day (BID) | ORAL | 3 refills | Status: AC
  Filled 2023-11-25: qty 180, fill #0
  Filled 2023-12-25: qty 180, 90d supply, fill #0
  Filled 2024-05-10: qty 180, 90d supply, fill #1

## 2023-11-25 MED ORDER — LACOSAMIDE 100 MG PO TABS
100.0000 mg | ORAL_TABLET | Freq: Every day | ORAL | 3 refills | Status: DC
  Filled 2023-11-25: qty 60, 30d supply, fill #0
  Filled 2023-12-01: qty 30, 30d supply, fill #0
  Filled 2023-12-01 – 2023-12-02 (×2): qty 180, 180d supply, fill #0

## 2023-11-25 NOTE — Patient Instructions (Signed)
 Good to meet you!  Schedule MRI brain with and without contrast  2. Schedule EEG  3. Continue Keppra  (Levetiracetam ) 1000mg  twice a day, Vimpat  (Lacosamide ) 100mg  twice a day. A new prescription for Lyrica  (Pregabalin ) 75mg : take 1 tablet three times a day  4. Follow-up in 6 months, call for any changes   Seizure Precautions: 1. If medication has been prescribed for you to prevent seizures, take it exactly as directed.  Do not stop taking the medicine without talking to your doctor first, even if you have not had a seizure in a long time.   2. Avoid activities in which a seizure would cause danger to yourself or to others.  Don't operate dangerous machinery, swim alone, or climb in high or dangerous places, such as on ladders, roofs, or girders.  Do not drive unless your doctor says you may.  3. If you have any warning that you may have a seizure, lay down in a safe place where you can't hurt yourself.    4.  No driving for 6 months from last seizure, as per   state law.   Please refer to the following link on the Epilepsy Foundation of America's website for more information: http://www.epilepsyfoundation.org/answerplace/Social/driving/drivingu.cfm   5.  Maintain good sleep hygiene.  6.  Contact your doctor if you have any problems that may be related to the medicine you are taking.  7.  Call 911 and bring the patient back to the ED if:        A.  The seizure lasts longer than 5 minutes.       B.  The patient doesn't awaken shortly after the seizure  C.  The patient has new problems such as difficulty seeing, speaking or moving  D.  The patient was injured during the seizure  E.  The patient has a temperature over 102 F (39C)  F.  The patient vomited and now is having trouble breathing

## 2023-11-25 NOTE — Progress Notes (Signed)
 NEUROLOGY CONSULTATION NOTE  Yolanda Krueger MRN: [id number] DOB: [birth year]  Referring provider: Dr. Kieth Johnson Primary care provider: Dr. Elio Art Smucker  Reason for consult:  establish care for seizures, history of metastatic melanoma  Dear Dr Johnson:  Thank you for your kind referral of Yolanda Krueger for consultation of the above symptoms. Although her history is well known to you, please allow me to reiterate it for the purpose of our medical record. She is alone in the office today. Records and images were personally reviewed where available.   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 Records from her prior neurologist at [facility] were reviewed. She is a poor historian, history obtained from medical records. She has had melanoma since 2010, metastatic since 2013. She has a history of left neck surgery and radiation in 2013 and 2016, Cyberknife SRS to a solitary medial right frontal brain metastasis in 2016, resection of 2 brain mets in 06/2017. Records indicate she had an intraoperative seizure and discharged on Keppra  and Vimpat . She was seeing [facility] up until 09/2020. She reports having only one seizure during Cyberknife procedure in 2018 however notes indicate she was having a seizure twice a month until March 2021. On her last visit at [facility] in 09/2020, last seizure was in March 2021 in the setting of running out of medication. She was shaking on the couch. Records indicate MRI brain at [facility] in 12/2018 showed right frontal encephalomalacia from prior resection, no acute changes. EEG in 11/2019 showed right frontal delta slowing. Ambulatory EEG in 02/2020 showed right frontotemporal slowing, no epileptiform discharges. On last visit, she was on Vimpat  100mg  BID, Keppra  1000mg  BID,  and Lyrica  75mg  TID.   She denies any seizures in several years. She has been with the same roommate for 10 years who has told her she does not respond when watching TV. She states she is deep in TV and this only occurs when watching TV. No other staring/unresponsive episodes. She denies any loss of time, olfactory/gustatory hallucinations, deja vu, rising epigastric sensation, myoclonic jerks.She has occasional left arm numbness. Sometimes she feels a little sick for a few minutes, no associated confusion.   She denies any headaches, diplopia, dysarthria/dysphagia, back pain, bowel/bladder dysfunction. She has left-sided neck pain from the cervical stenosis. She gets dizzy when standing. No falls since 2020. Mood is pretty good She gets 8 hours of sleep. No alcohol. She reports Lyrica  helps with her arm pain. She used to be on Gabapentin then switched to Lyrica . She is only on BID dosing which did not help as well as TID dosing. She does not drive. Memory is okay, she denies missing medication or bill payments.   Diagnostic Data:  MRI brain 11/2016: Small 8 mm cortical metastasis along the anterior right frontal lobe (series 16, image 67) with no associated hemorrhage, edema, or mass effect. Second but probably treated brain metastasis in the medial right frontal lobe near the cingulate gyrus, corresponding to the area of cerebral edema on the noncontrast study in November 2017.  MRI Brain 12/2018: Right frontal encephalomalacia from prior resection, no clear persistent/recurrent gad enhancement.   EEGs: vEEG (06/2017): Right frontal breach, moderate diffuse slowing R >L  REEG (11/2019): R frontal delta slowing, awake and drowsy   Ambulatory EEG results from 02/2020 - no clear epileptiform activities, just  slowing over right frontotemporal head region consistent with area of right frontal metastases resection.   MRI cervical spine 09/2023: Multilevel cervical spondylosis with resultant diffuse spinal  stenosis at C4-5 through C6-7, severe at the C4-5 level.Multifactorial degenerative changes with resultant multilevel foraminal narrowing as above. Notable findings include moderate to severe bilateral C5 and C6 foraminal stenosis, with moderate left and mild to moderate right C7 foraminal narrowing.   Epilepsy Risk Factors:  Metastatic melanoma s/p Cyberknife and resection. She had a normal birth and early development.  There is no history of febrile convulsions, CNS infections such as meningitis/encephalitis, significant traumatic brain injury, or family history of seizures.   PAST MEDICAL HISTORY: Past Medical History:  Diagnosis Date   Bipolar 1 disorder (HCC)    Cancer (HCC)    Hypothyroid    Malignant melanoma, metastatic (HCC)    PTSD (post-traumatic stress disorder)     PAST SURGICAL HISTORY: Past Surgical History:  Procedure Laterality Date   MASTECTOMY Bilateral 2007   Date is approximated   MELANOMA EXCISION      MEDICATIONS: Current Outpatient Medications on File Prior to Visit  Medication Sig Dispense Refill   atorvastatin  (LIPITOR) 40 MG tablet Take 1 tablet (40 mg total) by mouth daily. 90 tablet 3   cephALEXin  (KEFLEX ) 500 MG capsule Take 1 capsule (500 mg total) by mouth 3 (three) times daily. 90 capsule 0   DULoxetine  (CYMBALTA ) 60 MG capsule Take 1 capsule (60 mg total) by mouth daily. 30 capsule 0   Lacosamide  (VIMPAT ) 100 MG TABS Take 1 tablet (100 mg total) by mouth 2 (two) times daily for 14 days. 28 tablet 0   levETIRAcetam  (KEPPRA ) 1000 MG tablet Take 1 tablet (1,000 mg total) by mouth 2 (two) times daily. *Please see Neurology* 60 tablet 0   levothyroxine  (SYNTHROID ) 125 MCG tablet Take 1 tablet (125 mcg total) by mouth every morning 30 minutes before food. 90 tablet 3   pregabalin  (LYRICA ) 25 MG capsule Take 1 capsule (25 mg total) by mouth 2 (two) times daily as needed for up to 14 days. 28 capsule 0   ofloxacin  (OCUFLOX ) 0.3 % ophthalmic solution Place 1  drop into the right eye 4 (four) times daily for 1 week. (Patient not taking: Reported on 11/25/2023) 5 mL 1   prednisoLONE  acetate (PRED FORTE ) 1 % ophthalmic suspension Place 1 drop into the right eye 4 (four) times daily for 7 days, THEN 1 drop 2 (two) times daily for 7 days, THEN 1 drop daily for 14 days, then stop. (Patient not taking: No sig reported) 5 mL 2   No current facility-administered medications on file prior to visit.    ALLERGIES: Allergies  Allergen Reactions   Penicillins Rash    FAMILY HISTORY: Family History  Problem Relation Age of Onset   Stroke Mother    Heart disease Mother    Lung cancer Father    Liver cancer Father     SOCIAL HISTORY: Social History   Socioeconomic History   Marital status: Single    Spouse name: Not on file   Number of children: Not on file   Years of education: Not on file   Highest education level: Not on file  Occupational History   Not on file  Tobacco Use   Smoking status: Every Day    Current packs/day: 0.25    Types: Cigarettes   Smokeless tobacco: Never   Tobacco comments:     4 cigs a day. working on  quitting  Vaping Use   Vaping status: Never Used  Substance and Sexual Activity   Alcohol use: No   Drug use: No    Types: Cocaine    Comment: denies   Sexual activity: Not Currently  Other Topics Concern   Not on file  Social History Narrative   Living w/ other significant, 2nd floor apartment w/ 9 step   Coffee 2 cups a day, 2-3 soda a day.   Social Drivers of Corporate investment banker Strain: Not on file  Food Insecurity: Not on file  Transportation Needs: Not on file  Physical Activity: Not on file  Stress: Not on file  Social Connections: Not on file  Intimate Partner Violence: Not on file     PHYSICAL EXAM: Vitals:   11/25/23 0857  BP: 134/76  Pulse: 70   General: No acute distress Head:  Normocephalic/atraumatic Skin/Extremities: No rash, no edema Neurological Exam: Mental status:  alert and oriented to person, place, and time, no dysarthria or aphasia, Fund of knowledge is appropriate.  Recent and remote memory are intact.  Attention and concentration are normal.    Able to name objects and repeat phrases. Cranial nerves: CN I: not tested CN II: pupils equal, round and reactive to light, visual fields intact CN III, IV, VI:  full range of motion, no nystagmus, smaller left palpebral fissure CN V: facial sensation intact CN VII: upper and lower face symmetric CN VIII: hearing intact to conversation Bulk & Tone: normal, no fasciculations. Motor: 5/5 throughout with no pronator drift. Sensation: intact to light touch, cold, pin, vibration sense.  No extinction to double simultaneous stimulation.  Romberg test negative Deep Tendon Reflexes: +1 both UE, +2 both UE Cerebellar: no incoordination on finger to nose testing Gait: narrow-based and steady, able to tandem walk adequately. 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 She is a poor historian and denies any seizures since 2018, notes from [facility] as of 09/2020 report last seizure was 06/2019. She has not had any interval brain imaging since 2020, MRI brain with and without contrast will be ordered, as well as EEG. Continue Levetiracetam  1000mg  BID, Lacosamide  100mg  BID. She was previously on Lyrica  75mg  TID, this will be refilled today as well. She does not drive. Follow-up in 6 months, call for any changes.   Thank you for allowing me to participate in the care of this patient. Please do not hesitate to call for any questions or concerns.   Darice Shivers, M.D.  CC: Dr. Orlando, Dr. Napoleon

## 2023-11-27 ENCOUNTER — Other Ambulatory Visit (HOSPITAL_COMMUNITY): Payer: Self-pay

## 2023-11-27 ENCOUNTER — Ambulatory Visit: Admitting: Family Medicine

## 2023-11-27 ENCOUNTER — Encounter: Payer: Self-pay | Admitting: Family Medicine

## 2023-11-27 VITALS — BP 160/88 | Ht 64.0 in | Wt 113.0 lb

## 2023-11-27 DIAGNOSIS — G8929 Other chronic pain: Secondary | ICD-10-CM | POA: Diagnosis not present

## 2023-11-27 DIAGNOSIS — M542 Cervicalgia: Secondary | ICD-10-CM

## 2023-11-27 NOTE — Progress Notes (Signed)
   Discussed the use of AI scribe software for clinical note transcription with the patient, who gave verbal consent to proceed.  History of Present Illness   Yolanda Krueger is a 58 year old female with metastatic melanoma and chronic mastoiditis who presents with left lateral neck pain.  Left lateral neck pain and scar tenderness - Left lateral neck pain localized primarily over old incision lines from multiple prior neck surgeries - Significant scar tissue buildup in the affected area - Tenderness present along the left lateral neck - No associated fevers, chills, or signs of acute infection  Left mastoid tenderness and chronic mastoiditis - Mild tenderness over the left mastoid region - Completed a four-week course of antibiotics for chronic left mastoiditis - No recent fevers, chills, or other signs of infection  Metastatic melanoma with brain metastases and neuropathic pain - History of metastatic melanoma with brain metastases - Chronic neuropathic pain not yet managed with Lyrica  75 mg as recommended by neurology - New brain MRI ordered to assess current status of brain metastases  Psychiatric and chronic pain comorbidities - History of bipolar 1 disorder and major depression   Barriers to care -Financial and social (including housing) stressors impacting ability to maintain consistent medical care        PERTINENT  PMH / PSH: I have reviewed the patient's medications, allergies, past medical and surgical history, smoking status.  Pertinent findings that relate to today's visit / issues include:   Physical Exam   HEENT: Left ear with scarred tympanic membranes, no otitis media or effusion. NECK: s/p prior removal significant tissue notable on left side, mild torticollis      Results   Pending upcoming MRI brain. Prior CT head and neck concerning for radiation induced chronic mastoiditis, now s/p 4 weeks abx tehrapy       Assessment and Plan    Metastatic melanoma  with history of brain metastases Uncertain status due to loss of oncology follow-up. - Neurologist reordered brain MRI to update status.  Chronic left neck pain with scar tissue Chronic pain and limited neck range of motion due to scar tissue from past surgeries. - Start topical diclofenac gel to scar lesions. - Follow up in four weeks for musculoskeletal or myofascial issues.  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 Completed antibiotics with no signs of infection. - No current indication for antibiotics.  Chronic neuropathic pain Chronic neuropathic pain. - Start Lyrica  75 mg per neurologist. They will also be handling her sz medications -         Assessment & Plan Neck pain, chronic

## 2023-11-27 NOTE — Patient Instructions (Signed)
  Yolanda Jenkins Hoyles, MD (Attending Provider) NPI: [id number] [phone] (Work) [phone] (Fax) [id number] Phys Office [address] [place], [place] [address] Oncology [facility] [address] [place], [place] [address]

## 2023-12-01 ENCOUNTER — Other Ambulatory Visit (HOSPITAL_COMMUNITY): Payer: Self-pay

## 2023-12-02 ENCOUNTER — Other Ambulatory Visit (HOSPITAL_COMMUNITY): Payer: Self-pay

## 2023-12-02 ENCOUNTER — Other Ambulatory Visit: Payer: Self-pay | Admitting: Neurology

## 2023-12-02 DIAGNOSIS — C439 Malignant melanoma of skin, unspecified: Secondary | ICD-10-CM

## 2023-12-02 MED ORDER — LACOSAMIDE 100 MG PO TABS
100.0000 mg | ORAL_TABLET | Freq: Two times a day (BID) | ORAL | 3 refills | Status: AC
  Filled 2023-12-02 (×2): qty 60, 30d supply, fill #0
  Filled 2024-01-14: qty 60, 30d supply, fill #1
  Filled 2024-03-14 – 2024-03-24 (×2): qty 60, 30d supply, fill #2
  Filled 2024-05-10: qty 60, 30d supply, fill #3

## 2023-12-14 ENCOUNTER — Encounter: Payer: Self-pay | Admitting: Neurology

## 2023-12-14 ENCOUNTER — Other Ambulatory Visit

## 2023-12-18 ENCOUNTER — Ambulatory Visit: Admitting: Family Medicine

## 2023-12-20 ENCOUNTER — Other Ambulatory Visit

## 2023-12-25 ENCOUNTER — Other Ambulatory Visit: Payer: Self-pay

## 2023-12-25 ENCOUNTER — Other Ambulatory Visit: Payer: Self-pay | Admitting: Student

## 2023-12-25 ENCOUNTER — Other Ambulatory Visit (HOSPITAL_COMMUNITY): Payer: Self-pay

## 2023-12-25 DIAGNOSIS — F4310 Post-traumatic stress disorder, unspecified: Secondary | ICD-10-CM

## 2023-12-25 DIAGNOSIS — F329 Major depressive disorder, single episode, unspecified: Secondary | ICD-10-CM

## 2023-12-25 MED ORDER — DULOXETINE HCL 60 MG PO CPEP
60.0000 mg | ORAL_CAPSULE | Freq: Every day | ORAL | 0 refills | Status: DC
  Filled 2023-12-25: qty 30, 30d supply, fill #0

## 2023-12-28 ENCOUNTER — Other Ambulatory Visit (HOSPITAL_COMMUNITY): Payer: Self-pay

## 2023-12-30 ENCOUNTER — Other Ambulatory Visit (HOSPITAL_COMMUNITY): Payer: Self-pay

## 2023-12-30 MED ORDER — PREDNISOLONE ACETATE 1 % OP SUSP
1.0000 [drp] | OPHTHALMIC | 2 refills | Status: AC
  Filled 2023-12-30: qty 5, 34d supply, fill #0
  Filled 2024-01-12: qty 5, 25d supply, fill #0

## 2023-12-30 MED ORDER — OFLOXACIN 0.3 % OP SOLN
1.0000 [drp] | Freq: Four times a day (QID) | OPHTHALMIC | 1 refills | Status: AC
  Filled 2023-12-30: qty 5, 7d supply, fill #0
  Filled 2024-01-12: qty 5, 25d supply, fill #0

## 2024-01-01 DIAGNOSIS — H25811 Combined forms of age-related cataract, right eye: Secondary | ICD-10-CM | POA: Diagnosis not present

## 2024-01-11 ENCOUNTER — Other Ambulatory Visit

## 2024-01-12 ENCOUNTER — Other Ambulatory Visit (HOSPITAL_COMMUNITY): Payer: Self-pay

## 2024-01-14 ENCOUNTER — Other Ambulatory Visit: Payer: Self-pay

## 2024-01-14 ENCOUNTER — Other Ambulatory Visit (HOSPITAL_COMMUNITY): Payer: Self-pay

## 2024-01-20 ENCOUNTER — Other Ambulatory Visit (HOSPITAL_COMMUNITY): Payer: Self-pay

## 2024-01-28 ENCOUNTER — Ambulatory Visit: Admitting: Student

## 2024-01-28 ENCOUNTER — Other Ambulatory Visit (HOSPITAL_COMMUNITY): Payer: Self-pay

## 2024-01-28 VITALS — BP 137/71 | HR 59 | Wt 112.8 lb

## 2024-01-28 DIAGNOSIS — Z23 Encounter for immunization: Secondary | ICD-10-CM | POA: Diagnosis not present

## 2024-01-28 DIAGNOSIS — E039 Hypothyroidism, unspecified: Secondary | ICD-10-CM

## 2024-01-28 DIAGNOSIS — E785 Hyperlipidemia, unspecified: Secondary | ICD-10-CM

## 2024-01-28 DIAGNOSIS — R062 Wheezing: Secondary | ICD-10-CM

## 2024-01-28 MED ORDER — ALBUTEROL SULFATE HFA 108 (90 BASE) MCG/ACT IN AERS
2.0000 | INHALATION_SPRAY | Freq: Four times a day (QID) | RESPIRATORY_TRACT | 0 refills | Status: AC | PRN
  Filled 2024-01-28: qty 6.7, 17d supply, fill #0

## 2024-01-28 NOTE — Progress Notes (Signed)
    SUBJECTIVE:   CHIEF COMPLAINT / HPI:   Yolanda Krueger is a 58 y.o. female presenting for thyroid  follow up.   Hypothyroidism:  - taking medication daily as prescribed and before breakfast  - still continues to feel fatigued  - temperature intolerance present   PERTINENT  PMH / PSH: reviewed and updated.  OBJECTIVE:   BP 137/71   Pulse (!) 59   Wt 112 lb 12.8 oz (51.2 kg)   LMP  (LMP Unknown)   SpO2 100%   BMI 19.36 kg/m   Chronically ill-appearing, no acute distress Cardio: Regular rate, regular rhythm, no murmurs on exam. Pulm: Clear, no wheezing, no crackles. No increased work of breathing Abdominal: bowel sounds present, soft, non-tender, non-distended Extremities: no peripheral edema   ASSESSMENT/PLAN:   Assessment & Plan Hypothyroidism, unspecified type Recheck TSH today  Continue levothyroxine  125 mcg daily  Hyperlipidemia, unspecified hyperlipidemia type Recheck lipid panel  Encounter for immunization Flue shot given today  Wheezing Noted on exam, no respiratory distress  Advise smoking cessation  Prescribe albuterol  inhaler  Most likely needs PFTs      Damien Pinal, DO [facility]

## 2024-01-28 NOTE — Assessment & Plan Note (Signed)
 Recheck lipid panel

## 2024-01-28 NOTE — Patient Instructions (Signed)
 It was great to see you today!   I have ordered blood work today. I will send you a message through MyChart or send you a letter with your results. If there is an abnormal result, I will give you a call.    Future Appointments  Date Time Provider Department Center  02/12/2024  7:30 AM [facility] EEG TECH [facility] None  06/24/2024  8:30 AM Georjean Darice HERO, MD [facility] None    Please arrive 15 minutes before your appointment to ensure smooth check in process.  If you are more than 15 minutes late, you may be asked to reschedule.   Please bring a list of your medications with you to all appointments.   Please call the clinic at [phone] if your symptoms worsen or you have any concerns.  Thank you for allowing me to participate in your care, Dr. Damien Pinal [facility]

## 2024-01-28 NOTE — Assessment & Plan Note (Signed)
 Recheck TSH today  Continue levothyroxine  125 mcg daily

## 2024-01-29 LAB — LIPID PANEL
Chol/HDL Ratio: 1.8 ratio (ref 0.0–4.4)
Cholesterol, Total: 167 mg/dL (ref 100–199)
HDL: 91 mg/dL (ref >39)
LDL Chol Calc (NIH): 63 mg/dL (ref 0–99)
Triglycerides: 65 mg/dL (ref 0–149)
VLDL Cholesterol Cal: 13 mg/dL (ref 5–40)

## 2024-01-29 LAB — T4F: T4,Free (Direct): 1.42 ng/dL (ref 0.82–1.77)

## 2024-01-29 LAB — TSH RFX ON ABNORMAL TO FREE T4: TSH: 5.49 u[IU]/mL — ABNORMAL HIGH (ref 0.450–4.500)

## 2024-02-01 ENCOUNTER — Ambulatory Visit: Payer: Self-pay | Admitting: Student

## 2024-02-01 NOTE — Telephone Encounter (Signed)
 Lab result letter routed to staff

## 2024-02-04 ENCOUNTER — Telehealth: Payer: Self-pay

## 2024-02-04 NOTE — Telephone Encounter (Signed)
 Patient LVM on nurse line along with her friend, Charlie Lusher, regarding recent appt.   Patient states in voice message that we are lying on Richard and that she does not want Dr. Cleotilde to be caring for her any longer.   She is requesting to change PCP to previous provider.   Last few PCP's are no longer in the office, as they graduated the residency program.   Due to complex nature of this request, will forward to Dr. Anders and Harlene.   Yolanda JAYSON English, RN

## 2024-02-04 NOTE — Telephone Encounter (Signed)
 Current PCP is [facility]. She can submit PCP switch request if needed.

## 2024-02-05 ENCOUNTER — Ambulatory Visit: Admitting: Family Medicine

## 2024-02-05 ENCOUNTER — Encounter: Payer: Self-pay | Admitting: Family Medicine

## 2024-02-05 NOTE — Progress Notes (Unsigned)
 Telephone call with Yolanda Krueger, practice administrator and CMA in attendance:  Called patient regarding her appointment today.  Her family member has been dismissed from the practice.  I told her that we would still provide care for her , she is not discharged,but that her family member could not be present in the clinic with her anymore.  She said that they were a package deal and that she would find a doctor somewhere else.  Said she was going to cancel today as she is not feeling well. [place] they were most likely moving to the [place] area.  She canceled appointment today.

## 2024-02-12 ENCOUNTER — Ambulatory Visit (INDEPENDENT_AMBULATORY_CARE_PROVIDER_SITE_OTHER): Admitting: Neurology

## 2024-02-12 DIAGNOSIS — C439 Malignant melanoma of skin, unspecified: Secondary | ICD-10-CM

## 2024-02-12 DIAGNOSIS — G40209 Localization-related (focal) (partial) symptomatic epilepsy and epileptic syndromes with complex partial seizures, not intractable, without status epilepticus: Secondary | ICD-10-CM

## 2024-02-12 NOTE — Progress Notes (Signed)
 EEG complete and ready for review.

## 2024-02-15 NOTE — Procedures (Signed)
 ELECTROENCEPHALOGRAM REPORT  Date of Study: 02/12/2024  Patient's Name: Yolanda Krueger MRN: [id number] Date of Birth: 1966-03-31  Referring Provider: Dr. 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 EEG for classification.  CNS Active Medications: Keppra , Lacosamide , Lyrica , Cymbalta   Technical Summary: A multichannel digital EEG recording measured by the international 10-20 system with electrodes applied with paste and impedances below 5000 ohms performed in our laboratory with EKG monitoring in an awake and drowsy patient.  Hyperventilation was not performed. Photic stimulation was performed.  The digital EEG was referentially recorded, reformatted, and digitally filtered in a variety of bipolar and referential montages for optimal display.    Description: The patient is awake and drowsy during the recording.  During maximal wakefulness, there is a symmetric, medium voltage 9-10 Hz posterior dominant rhythm that attenuates with eye opening.  The record is symmetric.  During drowsiness and sleep, there is an increase in theta slowing of the background with shifting asymmetry over the bilateral temporal regions. Sleep was not captured. Photic stimulation did not elicit any abnormalities.  There were no epileptiform discharges or electrographic seizures seen.    EKG lead was unremarkable.  Impression: This awake and drowsy EEG is within normal limits.  Clinical Correlation: A normal EEG does not exclude a clinical diagnosis of epilepsy.  If further clinical questions remain, prolonged EEG may be helpful.  Clinical correlation is advised.   Darice Shivers, M.D.

## 2024-02-16 ENCOUNTER — Other Ambulatory Visit (HOSPITAL_COMMUNITY): Payer: Self-pay

## 2024-02-16 MED ORDER — OFLOXACIN 0.3 % OP SOLN
1.0000 [drp] | Freq: Four times a day (QID) | OPHTHALMIC | 1 refills | Status: AC
  Filled 2024-02-16: qty 5, 25d supply, fill #0

## 2024-02-16 MED ORDER — PREDNISOLONE ACETATE 1 % OP SUSP
OPHTHALMIC | 5 refills | Status: AC
  Filled 2024-02-16: qty 5, 28d supply, fill #0
  Filled 2024-03-24: qty 5, 30d supply, fill #0

## 2024-02-18 ENCOUNTER — Ambulatory Visit: Payer: Self-pay | Admitting: Neurology

## 2024-02-19 ENCOUNTER — Encounter: Payer: Self-pay | Admitting: Neurology

## 2024-02-19 DIAGNOSIS — H25812 Combined forms of age-related cataract, left eye: Secondary | ICD-10-CM | POA: Diagnosis not present

## 2024-02-19 DIAGNOSIS — H2512 Age-related nuclear cataract, left eye: Secondary | ICD-10-CM | POA: Diagnosis not present

## 2024-02-19 DIAGNOSIS — E039 Hypothyroidism, unspecified: Secondary | ICD-10-CM | POA: Diagnosis not present

## 2024-02-26 ENCOUNTER — Other Ambulatory Visit (HOSPITAL_COMMUNITY): Payer: Self-pay

## 2024-03-14 ENCOUNTER — Other Ambulatory Visit (HOSPITAL_COMMUNITY): Payer: Self-pay

## 2024-03-14 ENCOUNTER — Other Ambulatory Visit: Payer: Self-pay

## 2024-03-14 DIAGNOSIS — F4310 Post-traumatic stress disorder, unspecified: Secondary | ICD-10-CM

## 2024-03-14 DIAGNOSIS — F329 Major depressive disorder, single episode, unspecified: Secondary | ICD-10-CM

## 2024-03-14 MED ORDER — DULOXETINE HCL 60 MG PO CPEP
60.0000 mg | ORAL_CAPSULE | Freq: Every day | ORAL | 0 refills | Status: DC
  Filled 2024-03-14: qty 30, 30d supply, fill #0

## 2024-03-15 ENCOUNTER — Other Ambulatory Visit (HOSPITAL_COMMUNITY): Payer: Self-pay

## 2024-03-21 ENCOUNTER — Other Ambulatory Visit: Payer: Self-pay

## 2024-03-24 ENCOUNTER — Other Ambulatory Visit (HOSPITAL_COMMUNITY): Payer: Self-pay

## 2024-03-29 ENCOUNTER — Other Ambulatory Visit (HOSPITAL_COMMUNITY): Payer: Self-pay

## 2024-04-13 ENCOUNTER — Other Ambulatory Visit: Payer: Self-pay | Admitting: Family Medicine

## 2024-04-13 ENCOUNTER — Other Ambulatory Visit (HOSPITAL_COMMUNITY): Payer: Self-pay

## 2024-04-13 DIAGNOSIS — E785 Hyperlipidemia, unspecified: Secondary | ICD-10-CM

## 2024-04-13 MED ORDER — ATORVASTATIN CALCIUM 40 MG PO TABS
40.0000 mg | ORAL_TABLET | Freq: Every day | ORAL | 3 refills | Status: AC
  Filled 2024-04-13: qty 90, 90d supply, fill #0

## 2024-04-18 ENCOUNTER — Other Ambulatory Visit (HOSPITAL_COMMUNITY): Payer: Self-pay

## 2024-05-06 ENCOUNTER — Telehealth: Payer: Self-pay | Admitting: *Deleted

## 2024-05-06 DIAGNOSIS — E785 Hyperlipidemia, unspecified: Secondary | ICD-10-CM

## 2024-05-06 DIAGNOSIS — F319 Bipolar disorder, unspecified: Secondary | ICD-10-CM

## 2024-05-06 NOTE — Progress Notes (Unsigned)
 Complex Care Management Note Care Guide Note  05/06/2024 Name: Kalyna Paolella MRN: [id number] DOB: [birth year]   Complex Care Management Outreach Attempts: An unsuccessful telephone outreach was attempted today to offer the patient information about available complex care management services.  Follow Up Plan:  Additional outreach attempts will be made to offer the patient complex care management information and services.   Encounter Outcome:  No Answer Harlene Satterfield  [facility], [facility]  [facility], [facility] Guide  Direct Dial: [phone]  Fax [phone]

## 2024-05-10 ENCOUNTER — Other Ambulatory Visit: Payer: Self-pay

## 2024-05-10 ENCOUNTER — Other Ambulatory Visit (HOSPITAL_COMMUNITY): Payer: Self-pay

## 2024-05-10 DIAGNOSIS — F4310 Post-traumatic stress disorder, unspecified: Secondary | ICD-10-CM

## 2024-05-10 DIAGNOSIS — F329 Major depressive disorder, single episode, unspecified: Secondary | ICD-10-CM

## 2024-05-10 NOTE — Progress Notes (Unsigned)
 Complex Care Management Note Care Guide Note  05/10/2024 Name: Yolanda Krueger MRN: [id number] DOB: 1966-01-23   Complex Care Management Outreach Attempts: A second unsuccessful outreach was attempted today to offer the patient with information about available complex care management services.  Follow Up Plan:  Additional outreach attempts will be made to offer the patient complex care management information and services.   Encounter Outcome:  No Answer  Harlene Satterfield  [facility]  [facility], [facility] Guide  Direct Dial: [phone]  Fax [phone]

## 2024-05-11 ENCOUNTER — Other Ambulatory Visit (HOSPITAL_COMMUNITY): Payer: Self-pay

## 2024-05-11 MED ORDER — DULOXETINE HCL 60 MG PO CPEP
60.0000 mg | ORAL_CAPSULE | Freq: Every day | ORAL | 0 refills | Status: AC
  Filled 2024-05-11: qty 30, 30d supply, fill #0

## 2024-05-11 NOTE — Progress Notes (Signed)
 Complex Care Management Note Care Guide Note  05/11/2024 Name: Amaranta Mehl MRN: [id number] DOB: [birth year]   Complex Care Management Outreach Attempts: A third unsuccessful outreach was attempted today to offer the patient with information about available complex care management services.  Follow Up Plan:  No further outreach attempts will be made at this time. We have been unable to contact the patient to offer or enroll patient in complex care management services.  Encounter Outcome:  No Answer  Harlene Satterfield  [facility]  [facility], [facility] Guide  Direct Dial: [phone]  Fax [phone]

## 2024-06-24 ENCOUNTER — Ambulatory Visit: Admitting: Neurology
